# Patient Record
Sex: Male | Born: 1995 | Race: Black or African American | Hispanic: No | Marital: Single | Smoking: Current every day smoker
Health system: Southern US, Community
[De-identification: ages and names within clinical notes are randomized; demographics above are authoritative.]

## PROBLEM LIST (undated history)

## (undated) DIAGNOSIS — Z21 Asymptomatic human immunodeficiency virus [HIV] infection status: Secondary | ICD-10-CM

## (undated) DIAGNOSIS — B20 Human immunodeficiency virus [HIV] disease: Secondary | ICD-10-CM

## (undated) HISTORY — DX: Asymptomatic human immunodeficiency virus (hiv) infection status: Z21

## (undated) HISTORY — DX: Human immunodeficiency virus (HIV) disease: B20

---
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
## 2016-03-05 ENCOUNTER — Encounter (HOSPITAL_COMMUNITY): Payer: Self-pay

## 2016-03-05 ENCOUNTER — Emergency Department (HOSPITAL_COMMUNITY)
Admission: EM | Admit: 2016-03-05 | Discharge: 2016-03-05 | Disposition: A | Discharge status: Home / Self Care | Payer: Medicaid Other | Attending: Emergency Medicine | Admitting: Emergency Medicine

## 2016-03-05 DIAGNOSIS — R369 Urethral discharge, unspecified: Secondary | ICD-10-CM | POA: Diagnosis present

## 2016-03-05 DIAGNOSIS — F17200 Nicotine dependence, unspecified, uncomplicated: Secondary | ICD-10-CM | POA: Diagnosis not present

## 2016-03-05 DIAGNOSIS — Z7251 High risk heterosexual behavior: Secondary | ICD-10-CM

## 2016-03-05 MED ORDER — CEFTRIAXONE SODIUM 250 MG IJ SOLR
250.0000 mg | Freq: Once | INTRAMUSCULAR | Status: AC
  Administered 2016-03-05: 250 mg via INTRAMUSCULAR
  Filled 2016-03-05: qty 250

## 2016-03-05 MED ORDER — STERILE WATER FOR INJECTION IJ SOLN
INTRAMUSCULAR | Status: AC
  Filled 2016-03-05: qty 10

## 2016-03-05 MED ORDER — AZITHROMYCIN 250 MG PO TABS
1000.0000 mg | ORAL_TABLET | Freq: Once | ORAL | Status: AC
  Administered 2016-03-05: 1000 mg via ORAL
  Filled 2016-03-05: qty 4

## 2016-03-05 NOTE — ED Triage Notes (Signed)
Patient complains of penile discharge x 2 days, denies pain. NAD

## 2016-03-05 NOTE — ED Provider Notes (Signed)
[facility]-EMERGENCY DEPT Provider Note   CSN: [id number] Arrival date & time: 03/05/16  1619  By signing my name below, I, Eric Bass, attest that this documentation has been prepared under the direction and in the presence of  Eric BuffaloHope Ruvi Fullenwider, NP. Electronically Signed: Clovis PuAvnee Bass, ED Scribe. 03/05/16. 5:09 PM.   History   Chief Complaint Chief Complaint  Patient presents with  . Penile Discharge    The history is provided by the patient. No language interpreter was used.  Penile Discharge  This is a new problem. The current episode started yesterday. The problem has not changed since onset.Nothing aggravates the symptoms. Nothing relieves the symptoms. He has tried nothing for the symptoms.   HPI Comments:  Eric Bass is a 20 y.o. male, with a hx of chlamydia, who presents to the Emergency Department complaining of "greenish" penile discharge x 1 day. Pt notes he has had new sexual partners and last had unprotected sexual intercourse on 03/04/16. He also notes his girlfriend has been cheating on him. Pt also notes a mild rash on his R arm. No alleviating factors noted. He denies any penile pain, testicular medical problems and denies currently being on any medications. Pt is not followed by a PCP.   History reviewed. No pertinent past medical history.  Patient Active Problem List   Diagnosis Date Noted  . ATTENTION DEFICIT DISORDER 02/17/2008    History reviewed. No pertinent surgical history.   Home Medications    Prior to Admission medications   Medication Sig Start Date End Date Taking? Authorizing Provider  amphetamine-dextroamphetamine (ADDERALL, 5MG ,) 5 MG tablet 2 (two) times daily.      Historical Provider, MD    Family History No family history on file.  Social History Social History  Substance Use Topics  . Smoking status: Current Every Day Smoker  . Smokeless tobacco: Never Used  . Alcohol use No     Allergies   Review of patient's allergies indicates no  known allergies.   Review of Systems Review of Systems  Genitourinary: Positive for discharge. Negative for penile pain.  Skin: Positive for rash.   Physical Exam Updated Vital Signs BP 117/75 (BP Location: Right Arm)   Pulse 75   Temp 99.3 F (37.4 C) (Oral)   Resp 18   SpO2 100%   Physical Exam  Constitutional: He is oriented to person, place, and time. He appears well-developed and well-nourished. No distress.  HENT:  Head: Normocephalic and atraumatic.  Eyes: Conjunctivae are normal.  Cardiovascular: Normal rate.   Pulmonary/Chest: Effort normal.  Abdominal: He exhibits no distension.  Genitourinary:  Genitourinary Comments: BB sized lymph notes palpable to bilateral inguinal area.  Circumcised male. No testicular tenderness. Normal scrotum. No swelling of the penis, no rash or no lesions. Dry patchy skin to shaft of penis.   Neurological: He is alert and oriented to person, place, and time.  Skin: Skin is warm and dry.  Fine rash to R forearm and R side of upper abdomen. No open lesions.  Psychiatric: He has a normal mood and affect.  Nursing note and vitals reviewed.    ED Treatments / Results  DIAGNOSTIC STUDIES:  Oxygen Saturation is 100% on RA, normal by my interpretation.    COORDINATION OF CARE:  5:04 PM Discussed treatment plan with pt at bedside and pt agreed to plan.  Radiology No results found.  Procedures Procedures (including critical care time)  Medications Ordered in ED Medications  cefTRIAXone (ROCEPHIN) injection 250 mg (  250 mg Intramuscular Given 03/05/16 1740)  azithromycin (ZITHROMAX) tablet 1,000 mg (1,000 mg Oral Given 03/05/16 1740)     Initial Impression / Assessment and Plan / ED Course  I have reviewed the triage vital signs and the nursing notes.  Pertinent labs results that were available during my care of the patient were reviewed by me and considered in my medical decision making (see chart for details).  Clinical Course      Pt arrives for symptomatic STD check. Discussed safe sexual practices. Pt is advised to follow up for free testing at local health department in the future. Pt appears safe for discharge.    Final Clinical Impressions(s) / ED Diagnoses   Final diagnoses:  Penile discharge  Unprotected sex    New Prescriptions Discharge Medication List as of 03/05/2016  5:10 PM    I personally performed the services described in this documentation, which was scribed in my presence. The recorded information has been reviewed and is accurate.     [place], [place] 03/09/16 1157    Alvira Monday, MD 03/12/16 [phone]

## 2016-03-05 NOTE — ED Notes (Signed)
Declined W/C at D/C and was escorted to lobby by RN. 

## 2016-03-06 LAB — RPR, QUANT+TP ABS (REFLEX)
Rapid Plasma Reagin, Quant: 1:16 {titer} — ABNORMAL HIGH
TREPONEMA PALLIDUM AB: POSITIVE — AB

## 2016-03-06 LAB — GC/CHLAMYDIA PROBE AMP (~~LOC~~) NOT AT ARMC
Chlamydia: NEGATIVE
NEISSERIA GONORRHEA: NEGATIVE

## 2016-03-06 LAB — RPR: RPR Ser Ql: REACTIVE — AB

## 2016-03-07 ENCOUNTER — Telehealth: Payer: Self-pay

## 2016-03-07 ENCOUNTER — Telehealth (HOSPITAL_BASED_OUTPATIENT_CLINIC_OR_DEPARTMENT_OTHER): Payer: Self-pay | Admitting: Emergency Medicine

## 2016-03-07 LAB — HIV 1/2 AB DIFFERENTIATION
HIV 1 Ab: POSITIVE — AB
HIV 2 AB: NEGATIVE

## 2016-03-07 LAB — HIV ANTIBODY (ROUTINE TESTING W REFLEX)

## 2016-03-07 NOTE — Telephone Encounter (Signed)
Attempted to reach patient for a new infectious disease appointment.  His phone number does not have voicemail and he does not answer the phone.  I will defer to DIS .   Laurell Josephsammy K Daviel Allegretto, RN

## 2016-03-07 NOTE — Telephone Encounter (Signed)
Chart handoff to EDP for treatment plan for +RPR 1:16

## 2016-03-08 ENCOUNTER — Telehealth: Payer: Self-pay

## 2016-03-08 NOTE — Telephone Encounter (Signed)
[facility] received our referral and is looking for this patient.  He will go to his home today and I have give a scheduled appointment with Dr Ninetta LightsHatcher on 03-10-16. If Barbara CowerJason is not able to reach the patient he will call our office so the appointment can be cancelled.   Laurell Josephsammy K King, RN

## 2016-03-09 ENCOUNTER — Telehealth (HOSPITAL_BASED_OUTPATIENT_CLINIC_OR_DEPARTMENT_OTHER): Payer: Self-pay | Admitting: Emergency Medicine

## 2016-03-09 NOTE — Telephone Encounter (Signed)
Post ED Visit - Positive Culture Follow-up: Successful Patient Follow-Up  Culture assessed and recommendations reviewed by: []  Enzo BiNathan Batchelder, Pharm.D. []  Celedonio MiyamotoJeremy Frens, [address] BoulevardPharm.D., BCPS []  Garvin FilaMike Maccia, Pharm.D. []  Georgina PillionElizabeth Martin, Pharm.D., BCPS []  HermanMinh Pham, [address] BoulevardPharm.D., BCPS, AAHIVP []  Estella HuskMichelle Turner, Pharm.D., BCPS, AAHIVP []  Tennis Mustassie Stewart, Pharm.D. []  Sherle Poeob Vincent, [place]arm.D.  Positive RPR culture  [x]  Patient discharged without antimicrobial prescription and treatment is now indicated []  Organism is resistant to prescribed ED discharge antimicrobial []  Patient with positive blood cultures  Changes discussed with ED provider: Emmit AlexandersGeiple PA New antibiotic prescription needs to return to ED for IM Bicillin  Attempting to contact patient   Berle MullMiller, Hadja Harral 03/09/2016, 4:25 PM

## 2016-03-10 ENCOUNTER — Ambulatory Visit (INDEPENDENT_AMBULATORY_CARE_PROVIDER_SITE_OTHER): Payer: Self-pay | Admitting: Infectious Diseases

## 2016-03-10 ENCOUNTER — Encounter: Payer: Self-pay | Admitting: Infectious Diseases

## 2016-03-10 DIAGNOSIS — A539 Syphilis, unspecified: Secondary | ICD-10-CM | POA: Insufficient documentation

## 2016-03-10 DIAGNOSIS — B20 Human immunodeficiency virus [HIV] disease: Secondary | ICD-10-CM | POA: Insufficient documentation

## 2016-03-10 DIAGNOSIS — Z23 Encounter for immunization: Secondary | ICD-10-CM

## 2016-03-10 DIAGNOSIS — Z79899 Other long term (current) drug therapy: Secondary | ICD-10-CM

## 2016-03-10 LAB — COMPREHENSIVE METABOLIC PANEL
ALK PHOS: 101 U/L (ref 48–230)
ALT: 8 U/L (ref 8–46)
AST: 27 U/L (ref 12–32)
Albumin: 4 g/dL (ref 3.6–5.1)
BILIRUBIN TOTAL: 0.5 mg/dL (ref 0.2–1.1)
BUN: 12 mg/dL (ref 7–20)
CO2: 26 mmol/L (ref 20–31)
CREATININE: 1.16 mg/dL (ref 0.60–1.26)
Calcium: 9.7 mg/dL (ref 8.9–10.4)
Chloride: 102 mmol/L (ref 98–110)
Glucose, Bld: 88 mg/dL (ref 65–99)
Potassium: 4.2 mmol/L (ref 3.8–5.1)
SODIUM: 138 mmol/L (ref 135–146)
TOTAL PROTEIN: 7.9 g/dL (ref 6.3–8.2)

## 2016-03-10 LAB — T-HELPER CELL (CD4) - (RCID CLINIC ONLY)
CD4 T CELL ABS: 700 /uL (ref 400–2700)
CD4 T CELL HELPER: 25 % — AB (ref 33–55)

## 2016-03-10 LAB — URINALYSIS, ROUTINE W REFLEX MICROSCOPIC
BILIRUBIN URINE: NEGATIVE
Glucose, UA: NEGATIVE
Hgb urine dipstick: NEGATIVE
Leukocytes, UA: NEGATIVE
Nitrite: NEGATIVE
PH: 7 (ref 5.0–8.0)
SPECIFIC GRAVITY, URINE: 1.026 (ref 1.001–1.035)

## 2016-03-10 LAB — CBC
HCT: 45.2 % (ref 38.5–50.0)
Hemoglobin: 15.5 g/dL (ref 13.2–17.1)
MCH: 32.1 pg (ref 27.0–33.0)
MCHC: 34.3 g/dL (ref 32.0–36.0)
MCV: 93.6 fL (ref 80.0–100.0)
MPV: 10.9 fL (ref 7.5–12.5)
Platelets: 254 10*3/uL (ref 140–400)
RBC: 4.83 MIL/uL (ref 4.20–5.80)
RDW: 13.1 % (ref 11.0–15.0)
WBC: 7.9 10*3/uL (ref 3.8–10.8)

## 2016-03-10 LAB — URINALYSIS, MICROSCOPIC ONLY
BACTERIA UA: NONE SEEN [HPF]
Casts: NONE SEEN [LPF]
Squamous Epithelial / LPF: NONE SEEN [HPF] (ref <=5)
WBC, UA: NONE SEEN WBC/HPF (ref <=5)
Yeast: NONE SEEN [HPF]

## 2016-03-10 LAB — LIPID PANEL
Cholesterol: 161 mg/dL (ref 125–170)
HDL: 44 mg/dL (ref 31–65)
LDL Cholesterol: 99 mg/dL (ref <110)
Total CHOL/HDL Ratio: 3.7 Ratio (ref <=5.0)
Triglycerides: 89 mg/dL (ref 38–152)
VLDL: 18 mg/dL (ref <30)

## 2016-03-10 MED ORDER — HPV QUADRIVALENT VACCINE IM SUSP: 0.5000 mL | Freq: Once | INTRAMUSCULAR | 0 refills | Status: AC

## 2016-03-10 NOTE — Assessment & Plan Note (Signed)
He appears to be doing. Will check his labs to stage him for starting art He has condoms He is not sexually active currently. We spoke about safe sex.  We spoke about potential for kids, longevity.  We spoke about importance of medicine adherence.  HPV and flu shots today.  Hep B and A based on his serologies.

## 2016-03-10 NOTE — Progress Notes (Signed)
   Subjective:    Patient ID: Eric Bass, male    DOB: [birth year], 20 y.o.   MRN: [id number]  HPI 20 yo M with hx of ADHD, recently dx HIV after being seen in ED with STD, dysuria 10-8. He was given azithro and ceftriaxone at that time.   He has gotten his first dose of IM PEN at health dept yesterday. He did not have allergic reaction.  Had STI (?) 3 years ago. He had a plasma donation in April, suspected to be negative at that time. Is only getting one PEN injection.   No results found for: HIV1RNAQUANT, HIV1RNAVL, CD4TABS  Feels well.  Condom use is "rarely" Heterosexual  The past medical history, family history and social history were reviewed/updated in EPIC   Review of Systems  Constitutional: Negative for appetite change, chills, fever and unexpected weight change.  HENT: Negative for mouth sores.   Respiratory: Negative for cough and shortness of breath.   Gastrointestinal: Negative for constipation and diarrhea.  Genitourinary: Negative for difficulty urinating, dysuria, frequency and genital sores.  Neurological: Negative for headaches.  Hematological: Negative for adenopathy.       Objective:   Physical Exam  Constitutional: He is oriented to person, place, and time. He appears well-developed and well-nourished.  HENT:  Mouth/Throat: No oropharyngeal exudate.  Eyes: EOM are normal. Pupils are equal, round, and reactive to light.  Neck: Neck supple.  Cardiovascular: Normal rate, regular rhythm and normal heart sounds.   Pulmonary/Chest: Effort normal and breath sounds normal.  Abdominal: Soft. Bowel sounds are normal. There is no tenderness. There is no rebound.  Musculoskeletal: He exhibits no edema.  Lymphadenopathy:    He has no cervical adenopathy.  Neurological: He is alert and oriented to person, place, and time.          Assessment & Plan:

## 2016-03-10 NOTE — Assessment & Plan Note (Signed)
Has been treated at health dept Will watch RPR

## 2016-03-11 LAB — HEPATITIS B SURFACE ANTIBODY,QUALITATIVE

## 2016-03-11 LAB — HEPATITIS C ANTIBODY: HCV AB: NEGATIVE

## 2016-03-11 LAB — HEPATITIS A ANTIBODY, TOTAL: Hep A Total Ab: REACTIVE — AB

## 2016-03-11 LAB — HEPATITIS B SURFACE ANTIGEN: Hepatitis B Surface Ag: NEGATIVE

## 2016-03-13 LAB — QUANTIFERON TB GOLD ASSAY (BLOOD)
INTERFERON GAMMA RELEASE ASSAY: NEGATIVE
Mitogen-Nil: 2.42 IU/mL
Quantiferon Nil Value: 0.03 IU/mL
Quantiferon Tb Ag Minus Nil Value: <0 IU/mL

## 2016-03-14 LAB — HIV-1 RNA ULTRAQUANT REFLEX TO GENTYP+
HIV 1 RNA Quant: 20560 copies/mL — ABNORMAL HIGH (ref <20)
HIV-1 RNA Quant, Log: 4.31 Log copies/mL — ABNORMAL HIGH (ref <1.30)

## 2016-03-21 LAB — HLA B*5701: HLA-B*5701 w/rflx HLA-B High: NEGATIVE

## 2016-03-22 ENCOUNTER — Ambulatory Visit (INDEPENDENT_AMBULATORY_CARE_PROVIDER_SITE_OTHER): Payer: Self-pay | Admitting: Infectious Diseases

## 2016-03-22 ENCOUNTER — Encounter: Payer: Self-pay | Admitting: Infectious Diseases

## 2016-03-22 VITALS — BP 120/74 | HR 70 | Temp 97.6°F | Wt 162.0 lb

## 2016-03-22 DIAGNOSIS — B20 Human immunodeficiency virus [HIV] disease: Secondary | ICD-10-CM

## 2016-03-22 MED ORDER — ELVITEG-COBIC-EMTRICIT-TENOFAF 150-150-200-10 MG PO TABS: 1.0000 | ORAL_TABLET | Freq: Every day | ORAL | 3 refills | Status: DC

## 2016-03-22 NOTE — Assessment & Plan Note (Addendum)
Will start him on genvoya.  Will get him onto ADAP  Have him come back and see pharm in 2-3 weeks.  rtc in 6 weeks.  Explained his CD4 and HIV RNA to him and his mother.  Encouraged him to drink more water for his constipation.  Await his genotype

## 2016-03-22 NOTE — Progress Notes (Signed)
   Subjective:    Patient ID: Eric Bass, male    DOB: [birth year], 20 y.o.   MRN: [id number]  HPI 20 yo M with hx of ADHD, recently dx HIV after being seen in ED with STD, dysuria 10-8. He was given azithro and ceftriaxone at that time.   He was seen in ID 12 days ago and had staging labs.    HIV 1 RNA Quant (copies/mL)  Date Value  03/10/2016 20,560 (H)   CD4 T Cell Abs (/uL)  Date Value  03/10/2016 700    Review of Systems  Constitutional: Negative for appetite change and unexpected weight change.  Gastrointestinal: Positive for constipation. Negative for diarrhea.  Genitourinary: Negative for difficulty urinating.  abd cramping. BM q2-3 days. Doesn't drink water. Drinks soda and kool aid.      Objective:   Physical Exam  Constitutional: He appears well-developed and well-nourished.  HENT:  Mouth/Throat: No oropharyngeal exudate.    Eyes: EOM are normal. Pupils are equal, round, and reactive to light.  Neck: Neck supple.  Cardiovascular: Normal rate, regular rhythm and normal heart sounds.   Pulmonary/Chest: Effort normal and breath sounds normal.  Abdominal: Soft. Bowel sounds are normal. There is no tenderness. There is no rebound.  Musculoskeletal: He exhibits no edema.      Assessment & Plan:

## 2016-03-23 LAB — HIV-1 GENOTYPR PLUS

## 2016-03-24 ENCOUNTER — Encounter: Payer: Self-pay | Admitting: Infectious Diseases

## 2016-03-30 ENCOUNTER — Encounter: Payer: Self-pay | Admitting: *Deleted

## 2016-04-12 ENCOUNTER — Other Ambulatory Visit (INDEPENDENT_AMBULATORY_CARE_PROVIDER_SITE_OTHER): Payer: Self-pay

## 2016-04-12 DIAGNOSIS — B20 Human immunodeficiency virus [HIV] disease: Secondary | ICD-10-CM

## 2016-04-13 LAB — T-HELPER CELL (CD4) - (RCID CLINIC ONLY)
CD4 % Helper T Cell: 29 % — ABNORMAL LOW (ref 33–55)
CD4 T CELL ABS: 930 /uL (ref 400–2700)

## 2016-04-14 LAB — HIV-1 RNA QUANT-NO REFLEX-BLD
HIV 1 RNA Quant: 89 copies/mL — ABNORMAL HIGH (ref <20)
HIV-1 RNA Quant, Log: 1.95 Log copies/mL — ABNORMAL HIGH (ref <1.30)

## 2016-04-26 ENCOUNTER — Other Ambulatory Visit: Payer: Self-pay | Admitting: *Deleted

## 2016-04-26 ENCOUNTER — Encounter: Payer: Self-pay | Admitting: Infectious Diseases

## 2016-04-26 ENCOUNTER — Ambulatory Visit (INDEPENDENT_AMBULATORY_CARE_PROVIDER_SITE_OTHER): Payer: Self-pay | Admitting: Infectious Diseases

## 2016-04-26 VITALS — BP 102/67 | HR 64 | Temp 98.4°F | Ht 73.5 in | Wt 163.0 lb

## 2016-04-26 DIAGNOSIS — A539 Syphilis, unspecified: Secondary | ICD-10-CM

## 2016-04-26 DIAGNOSIS — Z79899 Other long term (current) drug therapy: Secondary | ICD-10-CM

## 2016-04-26 DIAGNOSIS — B20 Human immunodeficiency virus [HIV] disease: Secondary | ICD-10-CM

## 2016-04-26 DIAGNOSIS — Z113 Encounter for screening for infections with a predominantly sexual mode of transmission: Secondary | ICD-10-CM

## 2016-04-26 MED ORDER — ELVITEG-COBIC-EMTRICIT-TENOFAF 150-150-200-10 MG PO TABS: 1.0000 | ORAL_TABLET | Freq: Every day | ORAL | 5 refills | Status: DC

## 2016-04-26 NOTE — Assessment & Plan Note (Signed)
He is doing well.  Will gets meds via ADAP now Rtc in 6  Months with labs prior.  Has gotten flu shot.

## 2016-04-26 NOTE — Assessment & Plan Note (Signed)
Will repeat RPR at f/u visit/

## 2016-04-26 NOTE — Progress Notes (Signed)
   Subjective:    Patient ID: Eric LeverJoshua O Bass, male    DOB: July 10, 1995, 20 y.o.   MRN: [id number]  HPI 20 yo M with newly dx HIV 02-2016. He was started on genvoya.  He is transitioning from [facility] to [facility].   Has been doing well. Has had no problem with genvoya.    HIV 1 RNA Quant (copies/mL)  Date Value  04/12/2016 89 (H)  03/10/2016 20,560 (H)   CD4 T Cell Abs (/uL)  Date Value  04/12/2016 930  03/10/2016 700    Review of Systems  Constitutional: Negative for appetite change and unexpected weight change.  Gastrointestinal: Negative for constipation and diarrhea.  Genitourinary: Negative for difficulty urinating and dysuria.  prev constipation improved with increased water intake.      Objective:   Physical Exam  Constitutional: He appears well-developed and well-nourished.  HENT:  Mouth/Throat: No oropharyngeal exudate.  Eyes: EOM are normal. Pupils are equal, round, and reactive to light.  Neck: Neck supple.  Cardiovascular: Normal rate, regular rhythm and normal heart sounds.   Pulmonary/Chest: Effort normal and breath sounds normal.  Abdominal: Soft. Bowel sounds are normal. There is no tenderness. There is no rebound.  Musculoskeletal: He exhibits no edema.  Lymphadenopathy:    He has no cervical adenopathy.      Assessment & Plan:

## 2016-06-22 ENCOUNTER — Encounter: Payer: Self-pay | Admitting: Infectious Diseases

## 2016-10-24 ENCOUNTER — Other Ambulatory Visit: Payer: Self-pay

## 2016-10-26 ENCOUNTER — Other Ambulatory Visit (INDEPENDENT_AMBULATORY_CARE_PROVIDER_SITE_OTHER): Payer: Self-pay

## 2016-10-26 DIAGNOSIS — A539 Syphilis, unspecified: Secondary | ICD-10-CM

## 2016-10-26 DIAGNOSIS — Z113 Encounter for screening for infections with a predominantly sexual mode of transmission: Secondary | ICD-10-CM

## 2016-10-26 DIAGNOSIS — B20 Human immunodeficiency virus [HIV] disease: Secondary | ICD-10-CM

## 2016-10-26 DIAGNOSIS — Z79899 Other long term (current) drug therapy: Secondary | ICD-10-CM

## 2016-10-26 LAB — CBC
HCT: 44.9 % (ref 38.5–50.0)
HEMOGLOBIN: 15.2 g/dL (ref 13.2–17.1)
MCH: 32.9 pg (ref 27.0–33.0)
MCHC: 33.9 g/dL (ref 32.0–36.0)
MCV: 97.2 fL (ref 80.0–100.0)
MPV: 11.4 fL (ref 7.5–12.5)
Platelets: 200 10*3/uL (ref 140–400)
RBC: 4.62 MIL/uL (ref 4.20–5.80)
RDW: 13.2 % (ref 11.0–15.0)
WBC: 3.7 10*3/uL — AB (ref 3.8–10.8)

## 2016-10-26 LAB — COMPREHENSIVE METABOLIC PANEL
ALBUMIN: 4.2 g/dL (ref 3.6–5.1)
ALT: 12 U/L (ref 9–46)
AST: 24 U/L (ref 10–40)
Alkaline Phosphatase: 92 U/L (ref 40–115)
BUN: 10 mg/dL (ref 7–25)
CO2: 27 mmol/L (ref 20–31)
CREATININE: 1.27 mg/dL (ref 0.60–1.35)
Calcium: 9.1 mg/dL (ref 8.6–10.3)
Chloride: 104 mmol/L (ref 98–110)
GLUCOSE: 75 mg/dL (ref 65–99)
Potassium: 4.5 mmol/L (ref 3.5–5.3)
Sodium: 140 mmol/L (ref 135–146)
Total Bilirubin: 0.4 mg/dL (ref 0.2–1.2)
Total Protein: 7.4 g/dL (ref 6.1–8.1)

## 2016-10-26 LAB — LIPID PANEL
Cholesterol: 174 mg/dL (ref <200)
HDL: 65 mg/dL (ref >40)
LDL Cholesterol: 95 mg/dL (ref <100)
Total CHOL/HDL Ratio: 2.7 Ratio (ref <5.0)
Triglycerides: 71 mg/dL (ref <150)
VLDL: 14 mg/dL (ref <30)

## 2016-10-27 LAB — URINE CYTOLOGY ANCILLARY ONLY
Chlamydia: NEGATIVE
Neisseria Gonorrhea: NEGATIVE

## 2016-10-27 LAB — T-HELPER CELL (CD4) - (RCID CLINIC ONLY)
CD4 T CELL ABS: 800 /uL (ref 400–2700)
CD4 T CELL HELPER: 30 % — AB (ref 33–55)

## 2016-10-27 LAB — RPR

## 2016-10-28 LAB — HIV-1 RNA QUANT-NO REFLEX-BLD
HIV 1 RNA Quant: <20 copies/mL — AB
HIV-1 RNA Quant, Log: <1.3 Log copies/mL — AB

## 2016-11-07 ENCOUNTER — Ambulatory Visit: Payer: Self-pay | Admitting: Infectious Diseases

## 2016-11-08 ENCOUNTER — Encounter: Payer: Self-pay | Admitting: Infectious Diseases

## 2016-11-08 ENCOUNTER — Ambulatory Visit (INDEPENDENT_AMBULATORY_CARE_PROVIDER_SITE_OTHER): Admitting: Infectious Diseases

## 2016-11-08 DIAGNOSIS — B20 Human immunodeficiency virus [HIV] disease: Secondary | ICD-10-CM | POA: Diagnosis not present

## 2016-11-08 DIAGNOSIS — Z23 Encounter for immunization: Secondary | ICD-10-CM | POA: Diagnosis not present

## 2016-11-08 DIAGNOSIS — A539 Syphilis, unspecified: Secondary | ICD-10-CM | POA: Diagnosis not present

## 2016-11-08 NOTE — Progress Notes (Signed)
   Subjective:    Patient ID: Eric Bass, male    DOB: 18-May-1996, 21 y.o.   MRN: [id number]  HPI 21 yo M dx HIV 02-2016. He was started on genvoya.  He is currently incarcerated. Release in August.  Has been feeling well.  Has been getting meds.   HIV 1 RNA Quant (copies/mL)  Date Value  10/26/2016 <20 DETECTED (A)  04/12/2016 89 (H)  03/10/2016 20,560 (H)   CD4 T Cell Abs (/uL)  Date Value  10/26/2016 800  04/12/2016 930  03/10/2016 700    Review of Systems  Constitutional: Negative for activity change, appetite change, fever and unexpected weight change.  Respiratory: Negative for shortness of breath.   Gastrointestinal: Negative for constipation and diarrhea.  Genitourinary: Negative for difficulty urinating.  Neurological: Negative for headaches.  Psychiatric/Behavioral: Negative for dysphoric mood.       Objective:   Physical Exam  HENT:  Mouth/Throat: No oropharyngeal exudate.  Eyes: EOM are normal. Pupils are equal, round, and reactive to light.  Neck: Neck supple.  Cardiovascular: Normal rate, regular rhythm and normal heart sounds.   Pulmonary/Chest: Effort normal and breath sounds normal.  Abdominal: Soft. Bowel sounds are normal. There is no tenderness. There is no rebound.  Musculoskeletal: He exhibits no edema.  Lymphadenopathy:    He has no cervical adenopathy.  Psychiatric: He has a normal mood and affect.      Assessment & Plan:

## 2016-11-08 NOTE — Assessment & Plan Note (Signed)
He is doing very well Will see him back in 6 months.  HPV today.  F/u with ADAP coordinator.  Offered/refused condoms.

## 2016-11-08 NOTE — Addendum Note (Signed)
Addended by: Linnell FullingBRANNON, LATOYA N on: 11/08/2016 04:21 PM   Modules accepted: Orders

## 2016-11-08 NOTE — Assessment & Plan Note (Signed)
RPR (-) 09-2016

## 2016-11-17 ENCOUNTER — Other Ambulatory Visit: Payer: Self-pay | Admitting: Infectious Diseases

## 2016-11-17 DIAGNOSIS — B20 Human immunodeficiency virus [HIV] disease: Secondary | ICD-10-CM

## 2016-12-28 ENCOUNTER — Encounter: Payer: Self-pay | Admitting: Infectious Diseases

## 2017-06-07 ENCOUNTER — Other Ambulatory Visit: Payer: Self-pay

## 2017-06-07 ENCOUNTER — Ambulatory Visit

## 2017-06-07 DIAGNOSIS — B20 Human immunodeficiency virus [HIV] disease: Secondary | ICD-10-CM

## 2017-06-08 LAB — T-HELPER CELL (CD4) - (RCID CLINIC ONLY)
CD4 % Helper T Cell: 25 % — ABNORMAL LOW (ref 33–55)
CD4 T CELL ABS: 740 /uL (ref 400–2700)

## 2017-06-11 LAB — HIV-1 RNA QUANT-NO REFLEX-BLD
HIV 1 RNA QUANT: NOT DETECTED {copies}/mL
HIV-1 RNA QUANT, LOG: NOT DETECTED {Log_copies}/mL

## 2017-06-21 ENCOUNTER — Encounter: Payer: Self-pay | Admitting: Infectious Diseases

## 2017-06-21 ENCOUNTER — Ambulatory Visit (INDEPENDENT_AMBULATORY_CARE_PROVIDER_SITE_OTHER): Payer: Self-pay | Admitting: Infectious Diseases

## 2017-06-21 ENCOUNTER — Ambulatory Visit: Payer: Self-pay

## 2017-06-21 VITALS — BP 110/64 | HR 97 | Temp 99.6°F | Ht 73.0 in | Wt 175.0 lb

## 2017-06-21 DIAGNOSIS — B20 Human immunodeficiency virus [HIV] disease: Secondary | ICD-10-CM

## 2017-06-21 DIAGNOSIS — Z79899 Other long term (current) drug therapy: Secondary | ICD-10-CM

## 2017-06-21 DIAGNOSIS — Z113 Encounter for screening for infections with a predominantly sexual mode of transmission: Secondary | ICD-10-CM

## 2017-06-21 NOTE — Assessment & Plan Note (Signed)
He is doing very well.  Offered/refused condoms.  Not sexually active.  Has questions about prep, contagiousness, having kids. He is undetectable.  vax up to date.  rtc in 8 months.

## 2017-06-21 NOTE — Progress Notes (Signed)
   Subjective:    Patient ID: Eric Bass, male    DOB: 12/15/1995, 22 y.o.   MRN: [id number]  HPI 22 yo M dx HIV 02-2016. He was started on genvoya.  He is feeling well. Has been doing well with ART. Has re-upped his ADAP today.    HIV 1 RNA Quant (copies/mL)  Date Value  06/07/2017 <20 NOT DETECTED  10/26/2016 <20 DETECTED (A)  04/12/2016 89 (H)   CD4 T Cell Abs (/uL)  Date Value  06/07/2017 740  10/26/2016 800  04/12/2016 930    Review of Systems  Constitutional: Negative for appetite change, chills, fever and unexpected weight change.  Eyes: Negative for visual disturbance.  Respiratory: Negative for cough and shortness of breath.   Gastrointestinal: Negative for constipation and diarrhea.  Genitourinary: Negative for difficulty urinating and genital sores.  Psychiatric/Behavioral: Negative for dysphoric mood and sleep disturbance.  Please see HPI. All other systems reviewed and negative.     Objective:   Physical Exam  Constitutional: He appears well-developed.  HENT:  Mouth/Throat: No oropharyngeal exudate.  Eyes: EOM are normal. Pupils are equal, round, and reactive to light.  Neck: Neck supple.  Cardiovascular: Normal rate, regular rhythm and normal heart sounds.  Pulmonary/Chest: Effort normal and breath sounds normal.  Abdominal: Soft. Bowel sounds are normal. There is no tenderness. There is no rebound.  Musculoskeletal: He exhibits no edema.  Lymphadenopathy:    He has no cervical adenopathy.  Psychiatric: He has a normal mood and affect.       Assessment & Plan:

## 2017-06-25 ENCOUNTER — Other Ambulatory Visit: Payer: Self-pay | Admitting: Infectious Diseases

## 2017-06-25 DIAGNOSIS — B20 Human immunodeficiency virus [HIV] disease: Secondary | ICD-10-CM

## 2017-07-04 ENCOUNTER — Emergency Department (HOSPITAL_COMMUNITY)
Admission: EM | Admit: 2017-07-04 | Discharge: 2017-07-04 | Disposition: A | Discharge status: Home / Self Care | Attending: Emergency Medicine | Admitting: Emergency Medicine

## 2017-07-04 ENCOUNTER — Encounter (HOSPITAL_COMMUNITY): Payer: Self-pay

## 2017-07-04 DIAGNOSIS — J029 Acute pharyngitis, unspecified: Secondary | ICD-10-CM | POA: Insufficient documentation

## 2017-07-04 DIAGNOSIS — Z5321 Procedure and treatment not carried out due to patient leaving prior to being seen by health care provider: Secondary | ICD-10-CM | POA: Insufficient documentation

## 2017-07-04 NOTE — ED Triage Notes (Signed)
patient complains 2-3 days of sore throat. No other associated symptoms, aNAD

## 2017-07-04 NOTE — ED Notes (Signed)
Third call in lobby, no response. RN notified.

## 2017-07-04 NOTE — ED Notes (Signed)
Second call in lobby. No response. 

## 2017-07-04 NOTE — ED Notes (Signed)
Pt called in lobby to be roomed. No response. 

## 2017-07-20 ENCOUNTER — Encounter: Payer: Self-pay | Admitting: Infectious Diseases

## 2017-07-26 ENCOUNTER — Other Ambulatory Visit: Payer: Self-pay | Admitting: Infectious Diseases

## 2017-07-26 DIAGNOSIS — B20 Human immunodeficiency virus [HIV] disease: Secondary | ICD-10-CM

## 2017-10-27 ENCOUNTER — Emergency Department (HOSPITAL_COMMUNITY)
Admission: EM | Admit: 2017-10-27 | Discharge: 2017-10-27 | Disposition: A | Discharge status: Home / Self Care | Attending: Emergency Medicine | Admitting: Emergency Medicine

## 2017-10-27 ENCOUNTER — Other Ambulatory Visit: Payer: Self-pay

## 2017-10-27 ENCOUNTER — Encounter (HOSPITAL_COMMUNITY): Payer: Self-pay

## 2017-10-27 DIAGNOSIS — Z043 Encounter for examination and observation following other accident: Secondary | ICD-10-CM | POA: Insufficient documentation

## 2017-10-27 DIAGNOSIS — Z5321 Procedure and treatment not carried out due to patient leaving prior to being seen by health care provider: Secondary | ICD-10-CM | POA: Insufficient documentation
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
## 2017-11-26 ENCOUNTER — Ambulatory Visit: Payer: Self-pay

## 2017-12-19 ENCOUNTER — Encounter: Payer: Self-pay | Admitting: Infectious Diseases

## 2018-01-22 ENCOUNTER — Telehealth: Payer: Self-pay | Admitting: Behavioral Health

## 2018-01-22 NOTE — Telephone Encounter (Addendum)
Patient called in today stating he has tested positive for syphilis in the past and was treated.  He states he has had a new partner and currently has a rash on the palms of his hands and feet.  He thinks he may have been exposed to Syphilis again.  Advised him to go to the health department to be tested and treated.  Gave him the information to make an appointment at the health department.  Notified him of the next appointment he has scheduled with [facility].  Patient was appreciative and verbalized understanding. Angeline SlimAshley Chrisette Man RN

## 2018-02-04 ENCOUNTER — Emergency Department (HOSPITAL_COMMUNITY)
Admission: EM | Admit: 2018-02-04 | Discharge: 2018-02-04 | Discharge status: Against Medical Advice | Payer: Medicaid Other

## 2018-02-04 ENCOUNTER — Other Ambulatory Visit: Payer: Self-pay

## 2018-02-04 NOTE — ED Notes (Signed)
Called for triage x3, no answer. 

## 2018-02-05 ENCOUNTER — Other Ambulatory Visit

## 2018-02-11 ENCOUNTER — Encounter (HOSPITAL_COMMUNITY): Payer: Self-pay

## 2018-02-11 ENCOUNTER — Ambulatory Visit (HOSPITAL_COMMUNITY)
Admission: EM | Admit: 2018-02-11 | Discharge: 2018-02-11 | Disposition: A | Discharge status: Home / Self Care | Payer: Medicaid Other | Attending: Family Medicine | Admitting: Family Medicine

## 2018-02-11 DIAGNOSIS — A549 Gonococcal infection, unspecified: Secondary | ICD-10-CM

## 2018-02-11 DIAGNOSIS — Z21 Asymptomatic human immunodeficiency virus [HIV] infection status: Secondary | ICD-10-CM

## 2018-02-11 MED ORDER — CEFTRIAXONE SODIUM 250 MG IJ SOLR
INTRAMUSCULAR | Status: AC
  Filled 2018-02-11: qty 250

## 2018-02-11 MED ORDER — LIDOCAINE HCL (PF) 1 % IJ SOLN
INTRAMUSCULAR | Status: AC
  Filled 2018-02-11: qty 2

## 2018-02-11 MED ORDER — CEFTRIAXONE SODIUM 250 MG IJ SOLR
250.0000 mg | Freq: Once | INTRAMUSCULAR | Status: AC
  Administered 2018-02-11: 250 mg via INTRAMUSCULAR

## 2018-02-11 NOTE — Discharge Instructions (Signed)
You have been given the following medications today for treatment of gonorrhea.  cefTRIAXone (ROCEPHIN) injection 250 mg  Please refrain from all sexual activity for at least the next seven days.

## 2018-02-11 NOTE — ED Triage Notes (Signed)
Pt presents with complaints of coming in contact with STD after girlfriend cheated on him.  [facility] he had [facility] and was treated then got a phone call that he did not have Syphilis and had rectal gonorrhea. Pt has not been treated for gonorrhea.

## 2018-02-11 NOTE — ED Provider Notes (Signed)
[facility]   [id number] 02/11/18 Arrival Time: 1002  ASSESSMENT & PLAN:  1. Gonorrhea      Discharge Instructions     You have been given the following medications today for treatment of gonorrhea.  cefTRIAXone (ROCEPHIN) injection 250 mg  Please refrain from all sexual activity for at least the next seven days.    Reviewed expectations re: course of current medical issues. Questions answered. Outlined signs and symptoms indicating need for more acute intervention. Patient verbalized understanding. After Visit Summary given.   SUBJECTIVE:  Eric Bass is a 22 y.o. male who reports being called by his PCP and told his recent STD testing was positive for gonorrhea. Here for treatment. Afebrile. No current symptoms reported. HIV positive. Followed by ID.  ROS: As per HPI.  OBJECTIVE:  Vitals:   02/11/18 1121  BP: 111/71  Pulse: 75  Resp: 19  Temp: 98 F (36.7 C)  SpO2: 95%    General appearance: alert, cooperative, appears stated age and no distress Throat: lips, mucosa, and tongue normal; teeth and gums normal Back: no CVA tenderness GU: deferred Abdomen: soft, non-tender Skin: warm and dry Psychological: alert and cooperative. Normal mood and affect.  Results for orders placed or performed in visit on 06/07/17  HIV 1 RNA quant-no reflex-bld  Result Value Ref Range   HIV 1 RNA Quant <20 NOT DETECTED NOT DETECT copies/mL   HIV-1 RNA Quant, Log <1.30 NOT DETECTED NOT DETECT Log copies/mL  T-helper cell (CD4)- ([facility] only)  Result Value Ref Range   CD4 T Cell Abs 740 400 - 2,700 /uL   CD4 % Helper T Cell 25 (L) 33 - 55 %    Labs Reviewed - No data to display  No Known Allergies  Past Medical History:  Diagnosis Date  . HIV (human immunodeficiency virus infection) (HCC)    Family History  Problem Relation Age of Onset  . Hypertension Mother   . Diabetes Maternal Grandmother   . Hypertension Paternal Grandmother   .  Tuberculosis Paternal Grandmother   . Diabetes Paternal Grandfather   . Tuberculosis Paternal Grandfather    Social History   Socioeconomic History  . Marital status: Single    Spouse name: Not on file  . Number of children: Not on file  . Years of education: Not on file  . Highest education level: Not on file  Occupational History  . Not on file  Social Needs  . Financial resource strain: Not on file  . Food insecurity:    Worry: Not on file    Inability: Not on file  . Transportation needs:    Medical: Not on file    Non-medical: Not on file  Tobacco Use  . Smoking status: Current Every Day Smoker    Packs/day: 1.00    Types: Cigarettes    Start date: 05/30/1991  . Smokeless tobacco: Never Used  . Tobacco comment: not interested in quitting  Substance and Sexual Activity  . Alcohol use: Yes    Alcohol/week: 4.0 standard drinks    Types: 4 Shots of liquor per week    Comment: socially  . Drug use: Yes    Types: Marijuana    Comment: daily  . Sexual activity: Yes    Partners: Female    Comment: patient declined condoms  Lifestyle  . Physical activity:    Days per week: Not on file    Minutes per session: Not on file  . Stress: Not on  file  Relationships  . Social connections:    Talks on phone: Not on file    Gets together: Not on file    Attends religious service: Not on file    Active member of club or organization: Not on file    Attends meetings of clubs or organizations: Not on file    Relationship status: Not on file  . Intimate partner violence:    Fear of current or ex partner: Not on file    Emotionally abused: Not on file    Physically abused: Not on file    Forced sexual activity: Not on file  Other Topics Concern  . Not on file  Social History Narrative  . Not on file          Mardella Layman, MD 02/11/18 1237

## 2018-02-20 ENCOUNTER — Ambulatory Visit: Admitting: Infectious Diseases

## 2018-02-20 ENCOUNTER — Telehealth: Payer: Self-pay

## 2018-02-20 NOTE — Telephone Encounter (Signed)
Pharmacy called today requesting additional contact information due to issues reaching patient. Spoke with Mitzi Davenport who was able to take home phone number listed in patients chart. Pharmacy will attempt to call patient again with number given. Lorenso Courier, [place]

## 2018-03-04 ENCOUNTER — Other Ambulatory Visit

## 2018-03-07 ENCOUNTER — Other Ambulatory Visit (HOSPITAL_COMMUNITY)
Admission: RE | Admit: 2018-03-07 | Discharge: 2018-03-07 | Disposition: A | Discharge status: Home / Self Care | Payer: Medicaid Other | Source: Ambulatory Visit | Attending: Infectious Diseases | Admitting: Infectious Diseases

## 2018-03-07 ENCOUNTER — Other Ambulatory Visit: Payer: Medicaid Other

## 2018-03-07 DIAGNOSIS — Z113 Encounter for screening for infections with a predominantly sexual mode of transmission: Secondary | ICD-10-CM

## 2018-03-07 DIAGNOSIS — B20 Human immunodeficiency virus [HIV] disease: Secondary | ICD-10-CM

## 2018-03-07 DIAGNOSIS — Z79899 Other long term (current) drug therapy: Secondary | ICD-10-CM

## 2018-03-08 ENCOUNTER — Telehealth: Payer: Self-pay | Admitting: Behavioral Health

## 2018-03-08 LAB — T-HELPER CELL (CD4) - (RCID CLINIC ONLY)
CD4 % Helper T Cell: 37 % (ref 33–55)
CD4 T CELL ABS: 1180 /uL (ref 400–2700)

## 2018-03-08 LAB — URINE CYTOLOGY ANCILLARY ONLY
Chlamydia: NEGATIVE
Neisseria Gonorrhea: NEGATIVE

## 2018-03-08 NOTE — Telephone Encounter (Addendum)
Called patient x2, unable to leave voicemail because voicemail box is not set up.  Patient has an upcoming appointment with Dr. Ninetta Lights 03/18/2018.   Angeline Slim RN

## 2018-03-08 NOTE — Telephone Encounter (Signed)
-----   Message from Ginnie Smart, MD sent at 03/08/2018  3:31 PM EDT ----- Pt needs to be treated for syphilis.  Needs IM Pen G 2.4 million once weekly times 3 Thanks  ----- Message ----- From: Interface, [facility] Lab Results In Sent: 03/07/2018   3:11 PM EDT To: Ginnie Smart, MD

## 2018-03-11 LAB — COMPREHENSIVE METABOLIC PANEL
AG RATIO: 1.3 (calc) (ref 1.0–2.5)
ALBUMIN MSPROF: 4 g/dL (ref 3.6–5.1)
ALT: 10 U/L (ref 9–46)
AST: 25 U/L (ref 10–40)
Alkaline phosphatase (APISO): 91 U/L (ref 40–115)
BUN: 12 mg/dL (ref 7–25)
CHLORIDE: 103 mmol/L (ref 98–110)
CO2: 28 mmol/L (ref 20–32)
CREATININE: 1.06 mg/dL (ref 0.60–1.35)
Calcium: 9.4 mg/dL (ref 8.6–10.3)
GLOBULIN: 3.1 g/dL (ref 1.9–3.7)
GLUCOSE: 73 mg/dL (ref 65–99)
Potassium: 4 mmol/L (ref 3.5–5.3)
Sodium: 138 mmol/L (ref 135–146)
Total Bilirubin: 0.5 mg/dL (ref 0.2–1.2)
Total Protein: 7.1 g/dL (ref 6.1–8.1)

## 2018-03-11 LAB — RPR TITER

## 2018-03-11 LAB — CBC
HEMATOCRIT: 44.5 % (ref 38.5–50.0)
Hemoglobin: 15.3 g/dL (ref 13.2–17.1)
MCH: 32.8 pg (ref 27.0–33.0)
MCHC: 34.4 g/dL (ref 32.0–36.0)
MCV: 95.5 fL (ref 80.0–100.0)
MPV: 11.5 fL (ref 7.5–12.5)
PLATELETS: 225 10*3/uL (ref 140–400)
RBC: 4.66 10*6/uL (ref 4.20–5.80)
RDW: 13 % (ref 11.0–15.0)
WBC: 5.8 10*3/uL (ref 3.8–10.8)

## 2018-03-11 LAB — LIPID PANEL
Cholesterol: 162 mg/dL (ref <200)
HDL: 63 mg/dL (ref >40)
LDL Cholesterol (Calc): 78 mg/dL (calc)
Non-HDL Cholesterol (Calc): 99 mg/dL (calc) (ref <130)
TRIGLYCERIDES: 128 mg/dL (ref <150)
Total CHOL/HDL Ratio: 2.6 (calc) (ref <5.0)

## 2018-03-11 LAB — HEPATITIS B SURFACE ANTIBODY,QUALITATIVE: HEP B S AB: BORDERLINE — AB

## 2018-03-11 LAB — FLUORESCENT TREPONEMAL AB(FTA)-IGG-BLD: FLUORESCENT TREPONEMAL ABS: REACTIVE — AB

## 2018-03-11 LAB — RPR: RPR: REACTIVE — AB

## 2018-03-11 LAB — HIV-1 RNA QUANT-NO REFLEX-BLD
HIV 1 RNA Quant: <20 copies/mL
HIV-1 RNA Quant, Log: <1.3 Log copies/mL

## 2018-03-11 NOTE — Telephone Encounter (Signed)
Called Eric Bass verified identity.  Informed him per Dr. Ninetta Lights that he tested positive for Syphilis and needs to be treated with Bicillin time 3 weeks. Patient verbalized understanding and appointments made.  Patient denies Penicillin allergy. Angeline Slim RN

## 2018-03-12 ENCOUNTER — Ambulatory Visit (INDEPENDENT_AMBULATORY_CARE_PROVIDER_SITE_OTHER): Payer: Self-pay | Admitting: Behavioral Health

## 2018-03-12 DIAGNOSIS — A539 Syphilis, unspecified: Secondary | ICD-10-CM

## 2018-03-12 MED ORDER — PENICILLIN G BENZATHINE 1200000 UNIT/2ML IM SUSP
1.2000 10*6.[IU] | Freq: Once | INTRAMUSCULAR | Status: AC
  Administered 2018-03-12: 1.2 10*6.[IU] via INTRAMUSCULAR

## 2018-03-12 NOTE — Progress Notes (Signed)
Patient came in today to be treated for Syphilis.  Patient received Bicillin 2.4 million units today.  Patient tolerated well.  Offered condoms, patient refused.  Patient aware if all upcoming appointment. Angeline Slim RN

## 2018-03-18 ENCOUNTER — Ambulatory Visit (INDEPENDENT_AMBULATORY_CARE_PROVIDER_SITE_OTHER): Payer: Self-pay | Admitting: Infectious Diseases

## 2018-03-18 ENCOUNTER — Encounter: Payer: Self-pay | Admitting: Infectious Diseases

## 2018-03-18 ENCOUNTER — Encounter: Admitting: Infectious Diseases

## 2018-03-18 VITALS — BP 121/76 | HR 76 | Temp 98.3°F | Wt 175.0 lb

## 2018-03-18 DIAGNOSIS — B20 Human immunodeficiency virus [HIV] disease: Secondary | ICD-10-CM

## 2018-03-18 DIAGNOSIS — Z113 Encounter for screening for infections with a predominantly sexual mode of transmission: Secondary | ICD-10-CM

## 2018-03-18 DIAGNOSIS — Z79899 Other long term (current) drug therapy: Secondary | ICD-10-CM

## 2018-03-18 DIAGNOSIS — A539 Syphilis, unspecified: Secondary | ICD-10-CM

## 2018-03-18 DIAGNOSIS — Z23 Encounter for immunization: Secondary | ICD-10-CM

## 2018-03-18 MED ORDER — PENICILLIN G BENZATHINE 1200000 UNIT/2ML IM SUSP
1.2000 10*6.[IU] | Freq: Once | INTRAMUSCULAR | Status: AC
  Administered 2018-03-18: 1.2 10*6.[IU] via INTRAMUSCULAR

## 2018-03-18 NOTE — Assessment & Plan Note (Signed)
Pen G #2 today. Will need next dose in 1 week.  rtc in 9 months.

## 2018-03-18 NOTE — Progress Notes (Signed)
   Subjective:    Patient ID: Eric Bass, male    DOB: 05-18-1996, 22 y.o.   MRN: [id number]  HPI 22yo M dx HIV 02-2016. He was started on genvoya.  He is feeling well. Has been doing well with ART.  Has been treated several time for syphilis. Was given 1 shot previously this month.Was treated for [facility] 01-2018.    Denies that he has been sexually active in the intervening period.  Quit taking genvoya due to ETOH abuse after his mom had CVA and MI. Missed "a couple days".  Mom is improved now.   HIV 1 RNA Quant (copies/mL)  Date Value  03/07/2018 <20 NOT DETECTED  06/07/2017 <20 NOT DETECTED  10/26/2016 <20 DETECTED (A)   CD4 T Cell Abs (/uL)  Date Value  03/07/2018 1,180  06/07/2017 740  10/26/2016 800    Review of Systems  Constitutional: Negative for appetite change and unexpected weight change.  Gastrointestinal: Negative for constipation and diarrhea.  Genitourinary: Negative for difficulty urinating and genital sores.  Psychiatric/Behavioral: The patient is nervous/anxious.   Please see HPI. All other systems reviewed and negative.      Objective:   Physical Exam  Constitutional: He is oriented to person, place, and time. He appears well-developed and well-nourished.  HENT:  Mouth/Throat: No oropharyngeal exudate.  Eyes: Pupils are equal, round, and reactive to light. EOM are normal.  Neck: Normal range of motion. Neck supple.  Cardiovascular: Normal rate, regular rhythm and normal heart sounds.  Pulmonary/Chest: Effort normal and breath sounds normal.  Abdominal: Soft. Bowel sounds are normal. He exhibits no distension. There is no tenderness.  Musculoskeletal: He exhibits no edema.  Lymphadenopathy:    He has no cervical adenopathy.  Neurological: He is alert and oriented to person, place, and time.  Skin: Rash noted.          Assessment & Plan:

## 2018-03-18 NOTE — Assessment & Plan Note (Signed)
Given condoms Flu vax today.  He is doing well with his ART.  rtc in 9 months.

## 2018-03-18 NOTE — Addendum Note (Signed)
Addended by: Lurlean Leyden on: 03/18/2018 04:52 PM   Modules accepted: Orders

## 2018-03-25 ENCOUNTER — Ambulatory Visit: Payer: Medicaid Other

## 2018-04-17 ENCOUNTER — Encounter: Payer: Self-pay | Admitting: Infectious Diseases

## 2018-07-03 ENCOUNTER — Encounter: Payer: Self-pay | Admitting: Infectious Diseases

## 2018-07-27 ENCOUNTER — Other Ambulatory Visit: Payer: Self-pay | Admitting: Infectious Diseases

## 2018-07-27 DIAGNOSIS — B20 Human immunodeficiency virus [HIV] disease: Secondary | ICD-10-CM

## 2018-08-18 ENCOUNTER — Other Ambulatory Visit: Payer: Self-pay

## 2018-08-18 ENCOUNTER — Emergency Department (HOSPITAL_COMMUNITY)
Admission: EM | Admit: 2018-08-18 | Discharge: 2018-08-18 | Disposition: A | Discharge status: Home / Self Care | Payer: Medicaid Other | Attending: Emergency Medicine | Admitting: Emergency Medicine

## 2018-08-18 ENCOUNTER — Encounter (HOSPITAL_COMMUNITY): Payer: Self-pay | Admitting: Student

## 2018-08-18 DIAGNOSIS — B20 Human immunodeficiency virus [HIV] disease: Secondary | ICD-10-CM | POA: Insufficient documentation

## 2018-08-18 DIAGNOSIS — L02214 Cutaneous abscess of groin: Secondary | ICD-10-CM | POA: Insufficient documentation

## 2018-08-18 DIAGNOSIS — L0291 Cutaneous abscess, unspecified: Secondary | ICD-10-CM

## 2018-08-18 DIAGNOSIS — F17210 Nicotine dependence, cigarettes, uncomplicated: Secondary | ICD-10-CM | POA: Insufficient documentation

## 2018-08-18 MED ORDER — DOXYCYCLINE HYCLATE 100 MG PO CAPS: 100.0000 mg | ORAL_CAPSULE | Freq: Two times a day (BID) | ORAL | 0 refills | Status: DC

## 2018-08-18 MED ORDER — LIDOCAINE HCL (PF) 1 % IJ SOLN
5.0000 mL | Freq: Once | INTRAMUSCULAR | Status: AC
  Administered 2018-08-18: 5 mL
  Filled 2018-08-18: qty 5

## 2018-08-18 NOTE — Discharge Instructions (Addendum)
You were seen in the emergency department for a skin abscess- please see the attached handout for further information regarding this diagnoses. This area was incised and drained to help release the bacteria. We would like you to apply warm compresses and warm flushes to this area 4-5 times per day to help facilitate further draining as needed. We are also starting you on doxycycline, an antibiotic, in order to help treat the infection.   We have prescribed you new medication(s) today. Discuss the medications prescribed today with your pharmacist as they can have adverse effects and interactions with your other medicines including over the counter and prescribed medications. Seek medical evaluation if you start to experience new or abnormal symptoms after taking one of these medicines, seek care immediately if you start to experience difficulty breathing, feeling of your throat closing, facial swelling, or rash as these could be indications of a more serious allergic reaction  We would like you to have this area rechecked within 48 hours- please return to the ER , go to an urgent care, or see your primary care provider for this. Return to the ER sooner for new or worsening symptoms including, but not limited to increased pain, spreading redness, fevers, inability to keep fluids down, or any other concerns that you may have.   We also tested you for gonorrhea, chlamydia, and syphilis while you are present in the emergency department.  If positive we will call you with these test results.  If positive you will need to seek care at the health department or with your infectious disease doctor and inform all sexual partners.

## 2018-08-18 NOTE — ED Triage Notes (Signed)
Onset 1 week ago 2 lumps on right groin.  No increase in size, but pain is worsening.  Clear penile discharge.

## 2018-08-18 NOTE — ED Notes (Signed)
Patient verbalizes understanding of discharge instructions . Opportunity for questions and answers were provided . Armband removed by staff ,Pt discharged from ED. W/C  offered at D/C  and Declined W/C at D/C and was escorted to lobby by RN.  

## 2018-08-18 NOTE — ED Provider Notes (Signed)
[facility] EMERGENCY DEPARTMENT Provider Note   CSN: [id number] Arrival date & time: 08/18/18  1558    History   Chief Complaint Chief Complaint  Patient presents with  . Mass    HPI Eric Bass is a 23 y.o. male with a hx of HIV - currently well controlled on his medications that presents to the ER with concern for painful mass to the R groin x 1 week. Progressively worsening in pain, no change in size. Worse with palpation and movement at the groin, no alleviating factors. Tried tylenol PM without relief. No drainage from the area. When asked about penile discharge he does mention that he had some clear discharge when he was aroused a couple of days ago, no green/yellow content, no re-occurrence. Sexually active w/ 2 partners w/ use of protection. Denies fever, chills, dysuria, frequency, urgency, pain w/ bowel movements, or testicular pain/swelling.      HPI  Past Medical History:  Diagnosis Date  . HIV (human immunodeficiency virus infection) [facility])     Patient Active Problem List   Diagnosis Date Noted  . HIV disease (HCC) 03/10/2016  . Syphilis 03/10/2016  . ATTENTION DEFICIT DISORDER 02/17/2008    History reviewed. No pertinent surgical history.      Home Medications    Prior to Admission medications   Medication Sig Start Date End Date Taking? Authorizing Provider  GENVOYA 150-150-200-10 MG TABS tablet TAKE 1 TABLET BY MOUTH EVERY DAY WITH BREAKFAST 07/29/18   Ginnie Smart, MD    Family History Family History  Problem Relation Age of Onset  . Hypertension Mother   . Diabetes Maternal Grandmother   . Hypertension Paternal Grandmother   . Tuberculosis Paternal Grandmother   . Diabetes Paternal Grandfather   . Tuberculosis Paternal Grandfather     Social History Social History   Tobacco Use  . Smoking status: Current Every Day Smoker    Packs/day: 1.00    Types: Cigarettes    Start date: 05/30/1991  . Smokeless tobacco:  Never Used  . Tobacco comment: not interested in quitting  Substance Use Topics  . Alcohol use: Yes    Alcohol/week: 4.0 standard drinks    Types: 4 Shots of liquor per week    Comment: socially  . Drug use: Yes    Types: Marijuana    Comment: daily     Allergies   Patient has no known allergies.   Review of Systems Review of Systems  Constitutional: Negative for chills and fever.  Respiratory: Negative for shortness of breath.   Cardiovascular: Negative for chest pain.  Gastrointestinal: Negative for abdominal pain, anal bleeding, blood in stool, constipation, diarrhea, nausea, rectal pain and vomiting.  Genitourinary: Positive for discharge. Negative for dysuria, frequency, penile pain, penile swelling, scrotal swelling, testicular pain and urgency.  Skin: Positive for wound (painful mass to R groin).  All other systems reviewed and are negative.    Physical Exam Updated Vital Signs BP (!) 144/77 (BP Location: Right Arm)   Pulse 73   Temp 98.7 F (37.1 C) (Oral)   Resp 16   SpO2 100%   Physical Exam Vitals signs and nursing note reviewed. Exam conducted with a chaperone present.  Constitutional:      General: He is not in acute distress.    Appearance: He is well-developed. He is not toxic-appearing.  HENT:     Head: Normocephalic and atraumatic.  Eyes:     General:  Right eye: No discharge.        Left eye: No discharge.     Conjunctiva/sclera: Conjunctivae normal.  Neck:     Musculoskeletal: Neck supple.  Cardiovascular:     Rate and Rhythm: Normal rate and regular rhythm.  Pulmonary:     Effort: Pulmonary effort is normal. No respiratory distress.     Breath sounds: Normal breath sounds. No wheezing, rhonchi or rales.  Abdominal:     General: There is no distension.     Palpations: Abdomen is soft.     Tenderness: There is no abdominal tenderness.  Genitourinary:    Penis: Circumcised. No erythema, tenderness, discharge, swelling or lesions.       Scrotum/Testes: Normal. Cremasteric reflex is present.     Epididymis:     Right: No tenderness.     Left: No tenderness.     Comments: R groin: there is an area of induration to the R groin crease which extends to the R buttocks. There is a central area of fluctuance with mild erythema that measures approximately 2 cm in diameter. Fluctuant area is tender to palpation, otherwise nontender. No peri-anal tenderness/fluctuance. No gangrenous skin changes.  Skin:    General: Skin is warm and dry.     Findings: No rash.  Neurological:     Mental Status: He is alert.     Comments: Clear speech.   Psychiatric:        Behavior: Behavior normal.      ED Treatments / Results  Labs (all labs ordered are listed, but only abnormal results are displayed) Labs Reviewed  RPR  GC/CHLAMYDIA PROBE AMP ([place]) NOT AT [facility]    EKG None  Radiology No results found.  Procedures  EMERGENCY DEPARTMENT US SOFT TISSUE INTERPRETATION "Study: Limited Soft Tissue Ultrasound"  INDICATIONS: Soft tissue infection Multiple views of the body part were obtained in real-time with a multi-frequency linear probe  PERFORMED BY: Myself IMAGES ARCHIVED?: No SIDE:Right  BODY PART:groin INTERPRETATION:  Abcess present   .Marland KitchenIncision and Drainage Date/Time: 08/18/2018 5:03 PM Performed by: Cherly Anderson, PA-C Authorized by: Cherly Anderson, PA-C   Consent:    Consent obtained:  Verbal   Consent given by:  Patient   Risks discussed:  Bleeding, damage to other organs, infection, incomplete drainage and pain   Alternatives discussed:  No treatment Location:    Type:  Abscess   Size:  2   Location: R groin. Pre-procedure details:    Skin preparation:  Betadine Anesthesia (see MAR for exact dosages):    Anesthesia method:  Local infiltration   Local anesthetic:  Lidocaine 1% w/o epi Procedure type:    Complexity:  Simple Procedure details:    Incision types:  Stab incision    Scalpel blade:  11   Wound management:  Probed and deloculated, irrigated with saline and extensive cleaning   Drainage:  Bloody and purulent   Drainage amount:  Moderate   Wound treatment:  Wound left open   Packing materials:  None Post-procedure details:    Patient tolerance of procedure:  Tolerated well, no immediate complications   (including critical care time)      Medications Ordered in ED Medications  lidocaine (PF) (XYLOCAINE) 1 % injection 5 mL (5 mLs Infiltration Given 08/18/18 1629)     Initial Impression / Assessment and Plan / ED Course  I have reviewed the triage vital signs and the nursing notes.  Pertinent labs & imaging results that were  available during my care of the patient were reviewed by me and considered in my medical decision making (see chart for details).    Patient presents to the ED with abscess to R groin amenable to I&D based on exam confirmed on bedside US. Does not extend to rectum. Does not extend to scrotum/penis. Procedure per note above. Abscess cavity did not seem large enough to warrant packing/drain placement. Given mild surrounding cellulitis & hx of HIV (currently well controlled) will start patient on Doxycycline. Recommended application of warm compresses/soaks/flushing. Will have patient return for wound recheck in 2 days. Patient mentioned some penile discharge a few days prior, clear when sexually aroused, will test for GC/chlamydia & RPR, discussed protection, health department follow up, and need to seek tx/inform all sexual partners if positive. I discussed treatment plan, need for follow-up, and return precautions with the patient. Provided opportunity for questions, patient confirmed understanding and is in agreement with plan.    Findings and plan of care discussed with supervising physician Dr. Rosalia Hammers who evaluated patient & is in agreement.   Final Clinical Impressions(s) / ED Diagnoses   Final diagnoses:  Abscess    ED  Discharge Orders         Ordered    doxycycline (VIBRAMYCIN) 100 MG capsule  2 times daily     08/18/18 [address], [place], PA-C 08/18/18 1758    Margarita Grizzle, MD 08/24/18 [phone]

## 2018-08-19 LAB — GC/CHLAMYDIA PROBE AMP (~~LOC~~) NOT AT ARMC
Chlamydia: NEGATIVE
Neisseria Gonorrhea: NEGATIVE

## 2018-08-20 LAB — RPR: RPR Ser Ql: REACTIVE — AB

## 2018-08-20 LAB — RPR, QUANT+TP ABS (REFLEX)
Rapid Plasma Reagin, Quant: 1:2 {titer} — ABNORMAL HIGH
T Pallidum Abs: REACTIVE — AB

## 2018-09-12 ENCOUNTER — Other Ambulatory Visit: Payer: Self-pay

## 2018-09-12 ENCOUNTER — Emergency Department (HOSPITAL_COMMUNITY)
Admission: EM | Admit: 2018-09-12 | Discharge: 2018-09-12 | Disposition: A | Discharge status: Home / Self Care | Payer: Self-pay | Attending: Emergency Medicine | Admitting: Emergency Medicine

## 2018-09-12 ENCOUNTER — Encounter (HOSPITAL_COMMUNITY): Payer: Self-pay

## 2018-09-12 DIAGNOSIS — L0291 Cutaneous abscess, unspecified: Secondary | ICD-10-CM

## 2018-09-12 DIAGNOSIS — L02214 Cutaneous abscess of groin: Secondary | ICD-10-CM | POA: Insufficient documentation

## 2018-09-12 DIAGNOSIS — F909 Attention-deficit hyperactivity disorder, unspecified type: Secondary | ICD-10-CM | POA: Insufficient documentation

## 2018-09-12 DIAGNOSIS — Z79899 Other long term (current) drug therapy: Secondary | ICD-10-CM | POA: Insufficient documentation

## 2018-09-12 DIAGNOSIS — F17210 Nicotine dependence, cigarettes, uncomplicated: Secondary | ICD-10-CM | POA: Insufficient documentation

## 2018-09-12 MED ORDER — LIDOCAINE-EPINEPHRINE (PF) 2 %-1:200000 IJ SOLN
20.0000 mL | Freq: Once | INTRAMUSCULAR | Status: AC
  Administered 2018-09-12: 20 mL
  Filled 2018-09-12: qty 20

## 2018-09-12 MED ORDER — SULFAMETHOXAZOLE-TRIMETHOPRIM 800-160 MG PO TABS: 1.0000 | ORAL_TABLET | Freq: Two times a day (BID) | ORAL | 0 refills | Status: AC

## 2018-09-12 NOTE — ED Triage Notes (Signed)
Pt reports recurring boil/abcess in right groin.  Pt states its been present for 2 weeks and became red and painful yesterday.  Pt has tried warm compress with no relief.  Pt denies fever, SOB, or any UR symptoms.

## 2018-09-12 NOTE — ED Provider Notes (Signed)
[facility] EMERGENCY DEPARTMENT Provider Note   CSN: [id number] Arrival date & time: 09/12/18  1148    History   Chief Complaint Chief Complaint  Patient presents with  . Recurrent Skin Infections    HPI Eric Bass is a 23 y.o. male.     HPI   23 year old male presents today with complaints of abscess.  Patient notes he was seen on 08/18/2018 for an abscess in the same location.  He had an I&D performed at that time.  He was put on antibiotics.  He no symptoms improved, but again worsened over the last several days.  He notes swelling pain at the right groin region.  He denies any fever.  Past Medical History:  Diagnosis Date  . HIV (human immunodeficiency virus infection) [facility])     Patient Active Problem List   Diagnosis Date Noted  . HIV disease (HCC) 03/10/2016  . Syphilis 03/10/2016  . ATTENTION DEFICIT DISORDER 02/17/2008    History reviewed. No pertinent surgical history.      Home Medications    Prior to Admission medications   Medication Sig Start Date End Date Taking? Authorizing Provider  GENVOYA 150-150-200-10 MG TABS tablet TAKE 1 TABLET BY MOUTH EVERY DAY WITH BREAKFAST Patient taking differently: Take 1 tablet by mouth daily with breakfast.  07/29/18  Yes Ginnie Smart, MD  sulfamethoxazole-trimethoprim (BACTRIM DS,SEPTRA DS) 800-160 MG tablet Take 1 tablet by mouth 2 (two) times daily for 7 days. 09/12/18 09/19/18  Eyvonne Mechanic, PA-C    Family History Family History  Problem Relation Age of Onset  . Hypertension Mother   . Diabetes Maternal Grandmother   . Hypertension Paternal Grandmother   . Tuberculosis Paternal Grandmother   . Diabetes Paternal Grandfather   . Tuberculosis Paternal Grandfather     Social History Social History   Tobacco Use  . Smoking status: Current Every Day Smoker    Packs/day: 1.00    Types: Cigarettes    Start date: 05/30/1991  . Smokeless tobacco: Never Used  . Tobacco comment: not  interested in quitting  Substance Use Topics  . Alcohol use: Yes    Alcohol/week: 4.0 standard drinks    Types: 4 Shots of liquor per week    Comment: socially  . Drug use: Yes    Types: Marijuana    Comment: daily     Allergies   Patient has no known allergies.   Review of Systems Review of Systems  All other systems reviewed and are negative.    Physical Exam Updated Vital Signs BP 112/84 (BP Location: Right Arm)   Pulse 70   Temp 98 F (36.7 C)   Resp 16   Ht 6\' 1"  (1.854 m)   Wt 73 kg   SpO2 100%   BMI 21.24 kg/m   Physical Exam Vitals signs and nursing note reviewed.  Constitutional:      Appearance: He is well-developed.  HENT:     Head: Normocephalic and atraumatic.  Eyes:     General: No scleral icterus.       Right eye: No discharge.        Left eye: No discharge.     Conjunctiva/sclera: Conjunctivae normal.     Pupils: Pupils are equal, round, and reactive to light.  Neck:     Musculoskeletal: Normal range of motion.     Vascular: No JVD.     Trachea: No tracheal deviation.  Pulmonary:     Effort: Pulmonary effort  is normal.     Breath sounds: No stridor.  Skin:    Comments: 2 cm abscess to the right groin, central fluctuance noted no significant overlying redness  Neurological:     Mental Status: He is alert and oriented to person, place, and time.     Coordination: Coordination normal.  Psychiatric:        Behavior: Behavior normal.        Thought Content: Thought content normal.        Judgment: Judgment normal.    Chaperone present  ED Treatments / Results  Labs (all labs ordered are listed, but only abnormal results are displayed) Labs Reviewed - No data to display  EKG None  Radiology No results found.  Procedures .Marland Kitchen.Incision and Drainage Date/Time: 09/12/2018 1:30 PM Performed by: Eyvonne MechanicHedges, Caitlyne Ingham, PA-C Authorized by: Eyvonne MechanicHedges, Cortney Mckinney, PA-C   Consent:    Consent obtained:  Verbal   Consent given by:  Patient   Risks  discussed:  Bleeding, damage to other organs, infection and incomplete drainage   Alternatives discussed:  No treatment and alternative treatment Location:    Type:  Abscess   Size:  2 Anesthesia (see MAR for exact dosages):    Anesthesia method:  Local infiltration   Local anesthetic:  Lidocaine 2% WITH epi Procedure type:    Complexity:  Simple Procedure details:    Incision types:  Stab incision and single straight   Incision depth:  Dermal   Scalpel blade:  11   Wound management:  Probed and deloculated   Drainage:  Purulent   Drainage amount:  Moderate   Wound treatment:  Wound left open   Packing materials:  None Post-procedure details:    Patient tolerance of procedure:  Tolerated well, no immediate complications   (including critical care time)  Medications Ordered in ED Medications  lidocaine-EPINEPHrine (XYLOCAINE W/EPI) 2 %-1:200000 (PF) injection 20 mL (has no administration in time range)     Initial Impression / Assessment and Plan / ED Course  I have reviewed the triage vital signs and the nursing notes.  Pertinent labs & imaging results that were available during my care of the patient were reviewed by me and considered in my medical decision making (see chart for details).         Discharge Meds: Bactrim  Assessment/Plan: 23 year old male presents today with abscess.  I&D successful here.  This is reoccurring, I will have him follow-up with general surgery for repeat evaluation for reoccurring abscess.  Patient given strict return precautions, he verbalized understanding and agreement to today's plan had no further questions or concerns.    Final Clinical Impressions(s) / ED Diagnoses   Final diagnoses:  Abscess    ED Discharge Orders         Ordered    sulfamethoxazole-trimethoprim (BACTRIM DS,SEPTRA DS) 800-160 MG tablet  2 times daily     09/12/18 1327           Eyvonne MechanicHedges, Randalyn Ahmed, PA-C 09/12/18 1331    Cathren LaineSteinl, Kevin, MD 09/12/18 1907

## 2018-09-12 NOTE — ED Notes (Signed)
Patient verbalizes understanding of discharge instructions. Opportunity for questioning and answers were provided. Armband removed by staff, pt discharged from ED.  

## 2018-09-12 NOTE — Discharge Instructions (Addendum)
Please read attached information. If you experience any new or worsening signs or symptoms please return to the emergency room for evaluation. Please follow-up with your primary care provider or specialist as discussed. Please use medication prescribed only as directed and discontinue taking if you have any concerning signs or symptoms.   °

## 2018-11-10 ENCOUNTER — Encounter (HOSPITAL_COMMUNITY): Payer: Self-pay

## 2018-11-10 ENCOUNTER — Emergency Department (HOSPITAL_COMMUNITY)
Admission: EM | Admit: 2018-11-10 | Discharge: 2018-11-10 | Disposition: A | Discharge status: Home / Self Care | Payer: HRSA Program | Attending: Emergency Medicine | Admitting: Emergency Medicine

## 2018-11-10 DIAGNOSIS — Z20828 Contact with and (suspected) exposure to other viral communicable diseases: Secondary | ICD-10-CM | POA: Diagnosis not present

## 2018-11-10 DIAGNOSIS — Z21 Asymptomatic human immunodeficiency virus [HIV] infection status: Secondary | ICD-10-CM | POA: Diagnosis not present

## 2018-11-10 DIAGNOSIS — B349 Viral infection, unspecified: Secondary | ICD-10-CM | POA: Diagnosis not present

## 2018-11-10 DIAGNOSIS — F17210 Nicotine dependence, cigarettes, uncomplicated: Secondary | ICD-10-CM | POA: Insufficient documentation

## 2018-11-10 DIAGNOSIS — R509 Fever, unspecified: Secondary | ICD-10-CM | POA: Diagnosis present

## 2018-11-10 NOTE — ED Notes (Signed)
Patient verbalizes understanding of discharge instructions. Opportunity for questioning and answers were provided. Armband removed by staff, pt discharged from ED.  

## 2018-11-10 NOTE — ED Triage Notes (Signed)
Pt states that he has had a fever all day at home, report 102, took advil for this,some belly pain and dry mouth, denies cough

## 2018-11-10 NOTE — Discharge Instructions (Addendum)
Please stay at home until you hear about your covid 19 test results. Follow up with your PCP as well. Return to the ED for any worsening symptoms including shortness of breath, coughing up blood.      Person Under Monitoring Name: Eric Bass  [place] [address] [place] [id number]   Infection Prevention Recommendations for Individuals Confirmed to have, or Being Evaluated for, 2019 Novel Coronavirus (COVID-19) Infection Who Receive Care at Home  Individuals who are confirmed to have, or are being evaluated for, COVID-19 should follow the prevention steps below until a healthcare provider or local or state health department says they can return to normal activities.  Stay home except to get medical care You should restrict activities outside your home, except for getting medical care. Do not go to work, school, or public areas, and do not use public transportation or taxis.  Call ahead before visiting your doctor Before your medical appointment, call the healthcare provider and tell them that you have, or are being evaluated for, COVID-19 infection. This will help the healthcare providers office take steps to keep other people from getting infected. Ask your healthcare provider to call the local or state health department.  Monitor your symptoms Seek prompt medical attention if your illness is worsening (e.g., difficulty breathing). Before going to your medical appointment, call the healthcare provider and tell them that you have, or are being evaluated for, COVID-19 infection. Ask your healthcare provider to call the local or state health department.  Wear a facemask You should wear a facemask that covers your nose and mouth when you are in the same room with other people and when you visit a healthcare provider. People who live with or visit you should also wear a facemask while they are in the same room with you.  Separate yourself from other people in your  home As much as possible, you should stay in a different room from other people in your home. Also, you should use a separate bathroom, if available.  Avoid sharing household items You should not share dishes, drinking glasses, cups, eating utensils, towels, bedding, or other items with other people in your home. After using these items, you should wash them thoroughly with soap and water.  Cover your coughs and sneezes Cover your mouth and nose with a tissue when you cough or sneeze, or you can cough or sneeze into your sleeve. Throw used tissues in a lined trash can, and immediately wash your hands with soap and water for at least 20 seconds or use an alcohol-based hand rub.  Wash your Union Pacific Corporationhands Wash your hands often and thoroughly with soap and water for at least 20 seconds. You can use an alcohol-based hand sanitizer if soap and water are not available and if your hands are not visibly dirty. Avoid touching your eyes, nose, and mouth with unwashed hands.   Prevention Steps for Caregivers and Household Members of Individuals Confirmed to have, or Being Evaluated for, COVID-19 Infection Being Cared for in the Home  If you live with, or provide care at home for, a person confirmed to have, or being evaluated for, COVID-19 infection please follow these guidelines to prevent infection:  Follow healthcare providers instructions Make sure that you understand and can help the patient follow any healthcare provider instructions for all care.  Provide for the patients basic needs You should help the patient with basic needs in the home and provide support for getting groceries, prescriptions, and other personal  needs.  Monitor the patients symptoms If they are getting sicker, call his or her medical provider and tell them that the patient has, or is being evaluated for, COVID-19 infection. This will help the healthcare providers office take steps to keep other people from getting  infected. Ask the healthcare provider to call the local or state health department.  Limit the number of people who have contact with the patient If possible, have only one caregiver for the patient. Other household members should stay in another home or place of residence. If this is not possible, they should stay in another room, or be separated from the patient as much as possible. Use a separate bathroom, if available. Restrict visitors who do not have an essential need to be in the home.  Keep older adults, very young children, and other sick people away from the patient Keep older adults, very young children, and those who have compromised immune systems or chronic health conditions away from the patient. This includes people with chronic heart, lung, or kidney conditions, diabetes, and cancer.  Ensure good ventilation Make sure that shared spaces in the home have good air flow, such as from an air conditioner or an opened window, weather permitting.  Wash your hands often Wash your hands often and thoroughly with soap and water for at least 20 seconds. You can use an alcohol based hand sanitizer if soap and water are not available and if your hands are not visibly dirty. Avoid touching your eyes, nose, and mouth with unwashed hands. Use disposable paper towels to dry your hands. If not available, use dedicated cloth towels and replace them when they become wet.  Wear a facemask and gloves Wear a disposable facemask at all times in the room and gloves when you touch or have contact with the patients blood, body fluids, and/or secretions or excretions, such as sweat, saliva, sputum, nasal mucus, vomit, urine, or feces.  Ensure the mask fits over your nose and mouth tightly, and do not touch it during use. Throw out disposable facemasks and gloves after using them. Do not reuse. Wash your hands immediately after removing your facemask and gloves. If your personal clothing becomes  contaminated, carefully remove clothing and launder. Wash your hands after handling contaminated clothing. Place all used disposable facemasks, gloves, and other waste in a lined container before disposing them with other household waste. Remove gloves and wash your hands immediately after handling these items.  Do not share dishes, glasses, or other household items with the patient Avoid sharing household items. You should not share dishes, drinking glasses, cups, eating utensils, towels, bedding, or other items with a patient who is confirmed to have, or being evaluated for, COVID-19 infection. After the person uses these items, you should wash them thoroughly with soap and water.  Wash laundry thoroughly Immediately remove and wash clothes or bedding that have blood, body fluids, and/or secretions or excretions, such as sweat, saliva, sputum, nasal mucus, vomit, urine, or feces, on them. Wear gloves when handling laundry from the patient. Read and follow directions on labels of laundry or clothing items and detergent. In general, wash and dry with the warmest temperatures recommended on the label.  Clean all areas the individual has used often Clean all touchable surfaces, such as counters, tabletops, doorknobs, bathroom fixtures, toilets, phones, keyboards, tablets, and bedside tables, every day. Also, clean any surfaces that may have blood, body fluids, and/or secretions or excretions on them. Wear gloves when cleaning surfaces the  patient has come in contact with. Use a diluted bleach solution (e.g., dilute bleach with 1 part bleach and 10 parts water) or a household disinfectant with a label that says EPA-registered for coronaviruses. To make a bleach solution at home, add 1 tablespoon of bleach to 1 quart (4 cups) of water. For a larger supply, add  cup of bleach to 1 gallon (16 cups) of water. Read labels of cleaning products and follow recommendations provided on product labels. Labels  contain instructions for safe and effective use of the cleaning product including precautions you should take when applying the product, such as wearing gloves or eye protection and making sure you have good ventilation during use of the product. Remove gloves and wash hands immediately after cleaning.  Monitor yourself for signs and symptoms of illness Caregivers and household members are considered close contacts, should monitor their health, and will be asked to limit movement outside of the home to the extent possible. Follow the monitoring steps for close contacts listed on the symptom monitoring form.   ? If you have additional questions, contact your local health department or call the epidemiologist on call at [phone] (available 24/7). ? This guidance is subject to change. For the most up-to-date guidance from [facility], please refer to their website: [email or web address]

## 2018-11-10 NOTE — ED Provider Notes (Signed)
[name]: [id number] Arrival date & time: 11/10/18  1916    History   Chief Complaint Chief Complaint  Patient presents with  . Fever    HPI Eric Bass is a 23 y.o. male who presents to the ED for fever that began today, tmax 102. Pt reports he took some Aleve PTA; he is afebrile in the ED. Pt is concerned he could have covid 19 given symptoms; he works at [employer] and was also at a gathering yesterday where social distancing was not happening. Pt has hx of HIV and is concerned given his immunocompromised state. Reports his viral load has been undetectable and he is compliant with his medications. Denies cough, shortness of breath, headache, nausea, vomiting, sore throat, ear pain, sinus pressure.        Past Medical History:  Diagnosis Date  . HIV (human immunodeficiency virus infection) [facility](HCC)     Patient Active Problem List   Diagnosis Date Noted  . HIV disease (HCC) 03/10/2016  . Syphilis 03/10/2016  . ATTENTION DEFICIT DISORDER 02/17/2008    History reviewed. No pertinent surgical history.      Home Medications    Prior to Admission medications   Medication Sig Start Date End Date Taking? Authorizing Provider  GENVOYA 150-150-200-10 MG TABS tablet TAKE 1 TABLET BY MOUTH EVERY DAY WITH BREAKFAST Patient taking differently: Take 1 tablet by mouth daily with breakfast.  07/29/18   Ginnie SmartHatcher, Jeffrey C, MD    Family History Family History  Problem Relation Age of Onset  . Hypertension Mother   . Diabetes Maternal Grandmother   . Hypertension Paternal Grandmother   . Tuberculosis Paternal Grandmother   . Diabetes Paternal Grandfather   . Tuberculosis Paternal Grandfather     Social History Social History   Tobacco Use  . Smoking status: Current Every Day Smoker    Packs/day: 1.00    Types: Cigarettes    Start date: 05/30/1991  . Smokeless tobacco: Never Used  . Tobacco comment: not interested in  quitting  Substance Use Topics  . Alcohol use: Yes    Alcohol/week: 4.0 standard drinks    Types: 4 Shots of liquor per week    Comment: socially  . Drug use: Yes    Types: Marijuana    Comment: daily     Allergies   Patient has no known allergies.   Review of Systems Review of Systems  Constitutional: Positive for fever. Negative for chills.  HENT: Negative for congestion, ear pain, rhinorrhea, sinus pressure, sinus pain and sore throat.   Respiratory: Negative for cough and shortness of breath.   Gastrointestinal: Negative for abdominal pain, nausea and vomiting.     Physical Exam Updated Vital Signs BP 110/73   Pulse 92   Temp 99.7 F (37.6 C) (Oral)   Resp 18   SpO2 100%   Physical Exam Vitals signs and nursing note reviewed.  Constitutional:      Appearance: He is not ill-appearing.  HENT:     Head: Normocephalic and atraumatic.     Right Ear: Tympanic membrane normal.     Left Ear: Tympanic membrane normal.     Nose: Nose normal.     Mouth/Throat:     Mouth: Mucous membranes are moist.     Pharynx: No oropharyngeal exudate or posterior oropharyngeal erythema.  Eyes:     General:        Right eye: No discharge.  Left eye: No discharge.     Conjunctiva/sclera: Conjunctivae normal.  Cardiovascular:     Rate and Rhythm: Normal rate and regular rhythm.     Pulses: Normal pulses.  Pulmonary:     Effort: Pulmonary effort is normal.     Breath sounds: Normal breath sounds. No wheezing, rhonchi or rales.  Abdominal:     General: Abdomen is flat.     Tenderness: There is no abdominal tenderness. There is no guarding or rebound.  Lymphadenopathy:     Cervical: No cervical adenopathy.  Skin:    General: Skin is warm and dry.     Coloration: Skin is not jaundiced.  Neurological:     Mental Status: He is alert.      ED Treatments / Results  Labs (all labs ordered are listed, but only abnormal results are displayed) Labs Reviewed  NOVEL  CORONAVIRUS, NAA (HOSPITAL ORDER, SEND-OUT TO REF LAB)    EKG None  Radiology No results found.  Procedures Procedures (including critical care time)  Medications Ordered in ED Medications - No data to display   Initial Impression / Assessment and Plan / ED Course  I have reviewed the triage vital signs and the nursing notes.  Pertinent labs & imaging results that were available during my care of the patient were reviewed by me and considered in my medical decision making (see chart for details).    Pt is a 23 year old male with hx HIV presenting for a fever tmax 102 today; afebrile in the ED at 99.9 but took Aleve PTA. Concerned about covid 19 today; no known exposures but he does work at [employer]  and people have been coming in to eat recently. No other concerning symptoms today including headache; neck stiffness; rash; sore throat; ear pain; URI symptoms; cough; SOB. Given pts immunocompromised state will order send out covid 19 lab. Pt advised to stay at home until he hears about his results. He is in agreement with plan at this time and stable for discharge home.   Eric Bass was evaluated in Emergency Department on 11/10/2018 for the symptoms described in the history of present illness. He was evaluated in the context of the global COVID-19 pandemic, which necessitated consideration that the patient might be at risk for infection with the SARS-CoV-2 virus that causes COVID-19. Institutional protocols and algorithms that pertain to the evaluation of patients at risk for COVID-19 are in a state of rapid change based on information released by regulatory bodies including the CDC and federal and state organizations. These policies and algorithms were followed during the patient's care in the ED.        Final Clinical Impressions(s) / ED Diagnoses   Final diagnoses:  Viral illness    ED Discharge Orders    None       Eustaquio Maize, PA-C 11/10/18 2257    Drenda Freeze, MD 11/13/18 [phone]

## 2018-11-12 LAB — NOVEL CORONAVIRUS, NAA (HOSP ORDER, SEND-OUT TO REF LAB; TAT 18-24 HRS): SARS-CoV-2, NAA: NOT DETECTED

## 2018-12-09 ENCOUNTER — Ambulatory Visit: Payer: Self-pay

## 2018-12-09 ENCOUNTER — Other Ambulatory Visit: Payer: Self-pay

## 2018-12-09 DIAGNOSIS — Z113 Encounter for screening for infections with a predominantly sexual mode of transmission: Secondary | ICD-10-CM

## 2018-12-09 DIAGNOSIS — Z79899 Other long term (current) drug therapy: Secondary | ICD-10-CM

## 2018-12-09 DIAGNOSIS — B20 Human immunodeficiency virus [HIV] disease: Secondary | ICD-10-CM

## 2018-12-10 LAB — URINE CYTOLOGY ANCILLARY ONLY
Chlamydia: NEGATIVE
Neisseria Gonorrhea: NEGATIVE

## 2018-12-10 LAB — T-HELPER CELL (CD4) - (RCID CLINIC ONLY)
CD4 % Helper T Cell: 32 % — ABNORMAL LOW (ref 33–65)
CD4 T Cell Abs: 594 /uL (ref 400–1790)

## 2018-12-11 ENCOUNTER — Telehealth: Payer: Self-pay | Admitting: *Deleted

## 2018-12-11 NOTE — Telephone Encounter (Signed)
Per Eric Bass called patient to inform of his +RPR. He did not answer the phone and his mailbox was not set up so I was unable to leave a message for him. Will have to wait for him to call the office.     Campbell Riches, MD  P Rcid Triage Nurse Pool        Pt needs PEN G 2.4 million units IM x1. For syphillis.  No allergies listed.  thanks

## 2018-12-12 ENCOUNTER — Ambulatory Visit (INDEPENDENT_AMBULATORY_CARE_PROVIDER_SITE_OTHER): Payer: Self-pay | Admitting: *Deleted

## 2018-12-12 ENCOUNTER — Other Ambulatory Visit: Payer: Self-pay

## 2018-12-12 DIAGNOSIS — A539 Syphilis, unspecified: Secondary | ICD-10-CM

## 2018-12-12 LAB — COMPREHENSIVE METABOLIC PANEL
AG Ratio: 1 (calc) (ref 1.0–2.5)
ALT: 4 U/L — ABNORMAL LOW (ref 9–46)
AST: 23 U/L (ref 10–40)
Albumin: 4 g/dL (ref 3.6–5.1)
Alkaline phosphatase (APISO): 93 U/L (ref 36–130)
BUN: 11 mg/dL (ref 7–25)
CO2: 21 mmol/L (ref 20–32)
Calcium: 9.5 mg/dL (ref 8.6–10.3)
Chloride: 109 mmol/L (ref 98–110)
Creat: 1.13 mg/dL (ref 0.60–1.35)
Globulin: 3.9 g/dL (calc) — ABNORMAL HIGH (ref 1.9–3.7)
Glucose, Bld: 92 mg/dL (ref 65–99)
Potassium: 4.6 mmol/L (ref 3.5–5.3)
Sodium: 142 mmol/L (ref 135–146)
Total Bilirubin: 0.4 mg/dL (ref 0.2–1.2)
Total Protein: 7.9 g/dL (ref 6.1–8.1)

## 2018-12-12 LAB — CBC
HCT: 46.3 % (ref 38.5–50.0)
Hemoglobin: 15.8 g/dL (ref 13.2–17.1)
MCH: 32.6 pg (ref 27.0–33.0)
MCHC: 34.1 g/dL (ref 32.0–36.0)
MCV: 95.7 fL (ref 80.0–100.0)
MPV: 11.8 fL (ref 7.5–12.5)
Platelets: 231 10*3/uL (ref 140–400)
RBC: 4.84 10*6/uL (ref 4.20–5.80)
RDW: 12.4 % (ref 11.0–15.0)
WBC: 4.2 10*3/uL (ref 3.8–10.8)

## 2018-12-12 LAB — LIPID PANEL
Cholesterol: 187 mg/dL (ref <200)
HDL: 55 mg/dL (ref >=40)
LDL Cholesterol (Calc): 108 mg/dL (calc) — ABNORMAL HIGH
Non-HDL Cholesterol (Calc): 132 mg/dL (calc) — ABNORMAL HIGH (ref <130)
Total CHOL/HDL Ratio: 3.4 (calc) (ref <5.0)
Triglycerides: 128 mg/dL (ref <150)

## 2018-12-12 LAB — RPR: RPR Ser Ql: REACTIVE — AB

## 2018-12-12 LAB — FLUORESCENT TREPONEMAL AB(FTA)-IGG-BLD: Fluorescent Treponemal ABS: REACTIVE — AB

## 2018-12-12 LAB — HIV-1 RNA QUANT-NO REFLEX-BLD
HIV 1 RNA Quant: 49 copies/mL — ABNORMAL HIGH
HIV-1 RNA Quant, Log: 1.69 Log copies/mL — ABNORMAL HIGH

## 2018-12-12 LAB — RPR TITER: RPR Titer: 1:64 {titer} — ABNORMAL HIGH

## 2018-12-12 MED ORDER — PENICILLIN G BENZATHINE 1200000 UNIT/2ML IM SUSP
1.2000 10*6.[IU] | Freq: Once | INTRAMUSCULAR | Status: AC
  Administered 2018-12-12: 1.2 10*6.[IU] via INTRAMUSCULAR

## 2018-12-12 MED ORDER — PENICILLIN G BENZATHINE 1200000 UNIT/2ML IM SUSP
1.2000 10*6.[IU] | Freq: Once | INTRAMUSCULAR | Status: AC
  Administered 2018-12-12: 14:00:00 1.2 10*6.[IU] via INTRAMUSCULAR

## 2018-12-13 ENCOUNTER — Encounter: Payer: Self-pay | Admitting: Infectious Diseases

## 2018-12-16 ENCOUNTER — Ambulatory Visit: Payer: Self-pay

## 2018-12-24 ENCOUNTER — Ambulatory Visit: Payer: Self-pay | Admitting: Infectious Diseases

## 2018-12-27 ENCOUNTER — Encounter: Payer: Medicaid Other | Admitting: Infectious Diseases

## 2019-03-25 ENCOUNTER — Ambulatory Visit (HOSPITAL_COMMUNITY)
Admission: EM | Admit: 2019-03-25 | Discharge: 2019-03-25 | Disposition: A | Discharge status: Home / Self Care | Payer: Self-pay

## 2019-03-25 NOTE — ED Notes (Signed)
Patient seen leaving in car.

## 2019-06-12 ENCOUNTER — Other Ambulatory Visit: Payer: Self-pay

## 2019-06-12 ENCOUNTER — Ambulatory Visit (INDEPENDENT_AMBULATORY_CARE_PROVIDER_SITE_OTHER): Payer: Self-pay | Admitting: Infectious Diseases

## 2019-06-12 ENCOUNTER — Encounter: Payer: Self-pay | Admitting: Infectious Diseases

## 2019-06-12 VITALS — BP 129/71 | HR 101 | Temp 98.0°F | Ht 73.0 in | Wt 150.0 lb

## 2019-06-12 DIAGNOSIS — F988 Other specified behavioral and emotional disorders with onset usually occurring in childhood and adolescence: Secondary | ICD-10-CM

## 2019-06-12 DIAGNOSIS — Z23 Encounter for immunization: Secondary | ICD-10-CM

## 2019-06-12 DIAGNOSIS — B20 Human immunodeficiency virus [HIV] disease: Secondary | ICD-10-CM

## 2019-06-12 DIAGNOSIS — Z113 Encounter for screening for infections with a predominantly sexual mode of transmission: Secondary | ICD-10-CM

## 2019-06-12 DIAGNOSIS — A539 Syphilis, unspecified: Secondary | ICD-10-CM

## 2019-06-12 DIAGNOSIS — Z79899 Other long term (current) drug therapy: Secondary | ICD-10-CM

## 2019-06-12 NOTE — Addendum Note (Signed)
Addended by: Valarie Cones on: 06/12/2019 03:10 PM   Modules accepted: Orders

## 2019-06-12 NOTE — Assessment & Plan Note (Signed)
He is doing well Has had dental here. Would like more dental work. Will get him in  Offered/refused condoms.  Check labs today Flu, PCV 13, HPV #2, Me[place] #2 rtc in 9 months.

## 2019-06-12 NOTE — Assessment & Plan Note (Signed)
Appears to be stable, off rx.

## 2019-06-12 NOTE — Progress Notes (Signed)
   Subjective:    Patient ID: Eric Bass, male    DOB: [birth year], 24 y.o.   MRN: [id number]  HPI 23yo M dx HIV 02-2016. He was started on genvoya.  Has been working, taking his medication.  Attributes his wt loss to his work schedule.  "staying steady".  Mom is doing much better.  Was treated for syphilis at last visit.   HIV 1 RNA Quant (copies/mL)  Date Value  12/09/2018 49 (H)  03/07/2018 <20 NOT DETECTED  06/07/2017 <20 NOT DETECTED   CD4 T Cell Abs (/uL)  Date Value  12/09/2018 594  03/07/2018 1,180  06/07/2017 740    Review of Systems  Constitutional: Positive for unexpected weight change. Negative for appetite change.  Respiratory: Negative for shortness of breath and stridor.   Gastrointestinal: Negative for constipation and diarrhea.  Genitourinary: Negative for difficulty urinating.  Please see HPI. All other systems reviewed and negative.     Objective:   Physical Exam Constitutional:      Appearance: Normal appearance. He is normal weight.  HENT:     Mouth/Throat:     Mouth: Mucous membranes are moist.     Pharynx: No oropharyngeal exudate.  Eyes:     Extraocular Movements: Extraocular movements intact.     Pupils: Pupils are equal, round, and reactive to light.  Cardiovascular:     Rate and Rhythm: Normal rate and regular rhythm.  Pulmonary:     Effort: Pulmonary effort is normal.     Breath sounds: Normal breath sounds.  Abdominal:     General: Bowel sounds are normal. There is no distension.     Palpations: Abdomen is soft.     Tenderness: There is no abdominal tenderness.  Musculoskeletal:        General: Normal range of motion.     Cervical back: Normal range of motion and neck supple.     Right lower leg: No edema.     Left lower leg: No edema.  Neurological:     General: No focal deficit present.     Mental Status: He is alert.  Psychiatric:        Mood and Affect: Mood normal.           Assessment & Plan:

## 2019-06-12 NOTE — Assessment & Plan Note (Signed)
Will recheck his RPR today.

## 2019-06-18 LAB — COMPREHENSIVE METABOLIC PANEL
AG Ratio: 1.5 (calc) (ref 1.0–2.5)
ALT: 11 U/L (ref 9–46)
AST: 24 U/L (ref 10–40)
Albumin: 4.2 g/dL (ref 3.6–5.1)
Alkaline phosphatase (APISO): 85 U/L (ref 36–130)
BUN: 10 mg/dL (ref 7–25)
CO2: 27 mmol/L (ref 20–32)
Calcium: 9.6 mg/dL (ref 8.6–10.3)
Chloride: 102 mmol/L (ref 98–110)
Creat: 1.17 mg/dL (ref 0.60–1.35)
Globulin: 2.8 g/dL (calc) (ref 1.9–3.7)
Glucose, Bld: 81 mg/dL (ref 65–99)
Potassium: 4.3 mmol/L (ref 3.5–5.3)
Sodium: 138 mmol/L (ref 135–146)
Total Bilirubin: 0.5 mg/dL (ref 0.2–1.2)
Total Protein: 7 g/dL (ref 6.1–8.1)

## 2019-06-18 LAB — CBC
HCT: 42.3 % (ref 38.5–50.0)
Hemoglobin: 14.9 g/dL (ref 13.2–17.1)
MCH: 34.5 pg — ABNORMAL HIGH (ref 27.0–33.0)
MCHC: 35.2 g/dL (ref 32.0–36.0)
MCV: 97.9 fL (ref 80.0–100.0)
MPV: 11.8 fL (ref 7.5–12.5)
Platelets: 199 10*3/uL (ref 140–400)
RBC: 4.32 10*6/uL (ref 4.20–5.80)
RDW: 12.2 % (ref 11.0–15.0)
WBC: 4.5 10*3/uL (ref 3.8–10.8)

## 2019-06-18 LAB — RPR TITER: RPR Titer: 1:16 {titer} — ABNORMAL HIGH

## 2019-06-18 LAB — HIV-1 RNA ULTRAQUANT REFLEX TO GENTYP+
HIV 1 RNA Quant: <20 copies/mL
HIV-1 RNA Quant, Log: <1.3 Log copies/mL

## 2019-06-18 LAB — FLUORESCENT TREPONEMAL AB(FTA)-IGG-BLD: Fluorescent Treponemal ABS: REACTIVE — AB

## 2019-06-18 LAB — RPR: RPR Ser Ql: REACTIVE — AB

## 2019-06-20 ENCOUNTER — Ambulatory Visit: Payer: Self-pay

## 2019-06-20 ENCOUNTER — Other Ambulatory Visit: Payer: Self-pay

## 2019-10-03 ENCOUNTER — Encounter: Payer: Self-pay | Admitting: Infectious Diseases

## 2019-11-10 ENCOUNTER — Other Ambulatory Visit: Payer: Self-pay

## 2019-11-10 ENCOUNTER — Ambulatory Visit: Payer: Self-pay | Admitting: *Deleted

## 2019-11-10 DIAGNOSIS — B20 Human immunodeficiency virus [HIV] disease: Secondary | ICD-10-CM

## 2019-11-10 NOTE — Progress Notes (Signed)
Patient will get his Covid immunization tomorrow, declines HPV today. He will get this at his office visit. Andree Coss, RN

## 2019-11-14 ENCOUNTER — Other Ambulatory Visit: Payer: Self-pay | Admitting: Infectious Diseases

## 2019-11-14 DIAGNOSIS — B20 Human immunodeficiency virus [HIV] disease: Secondary | ICD-10-CM

## 2019-12-10 ENCOUNTER — Ambulatory Visit: Payer: Self-pay

## 2019-12-22 ENCOUNTER — Ambulatory Visit: Payer: Self-pay

## 2019-12-31 ENCOUNTER — Other Ambulatory Visit: Payer: Self-pay

## 2019-12-31 ENCOUNTER — Ambulatory Visit: Payer: Self-pay

## 2020-01-29 ENCOUNTER — Encounter: Payer: Self-pay | Admitting: Infectious Diseases

## 2020-02-12 ENCOUNTER — Ambulatory Visit: Payer: Self-pay

## 2020-03-01 ENCOUNTER — Other Ambulatory Visit: Payer: Self-pay

## 2020-03-09 ENCOUNTER — Other Ambulatory Visit: Payer: Self-pay

## 2020-03-09 DIAGNOSIS — Z113 Encounter for screening for infections with a predominantly sexual mode of transmission: Secondary | ICD-10-CM

## 2020-03-09 DIAGNOSIS — Z79899 Other long term (current) drug therapy: Secondary | ICD-10-CM

## 2020-03-09 DIAGNOSIS — B20 Human immunodeficiency virus [HIV] disease: Secondary | ICD-10-CM

## 2020-03-10 LAB — T-HELPER CELL (CD4) - (RCID CLINIC ONLY)
CD4 % Helper T Cell: 39 % (ref 33–65)
CD4 T Cell Abs: 971 /uL (ref 400–1790)

## 2020-03-10 LAB — URINE CYTOLOGY ANCILLARY ONLY
Chlamydia: NEGATIVE
Comment: NEGATIVE
Comment: NORMAL
Neisseria Gonorrhea: NEGATIVE

## 2020-03-11 ENCOUNTER — Telehealth: Payer: Self-pay | Admitting: *Deleted

## 2020-03-11 NOTE — Telephone Encounter (Signed)
Attempted to call patient to relay results. Call went to voicemail that is not yet set up on cell phone.  RN called the number listed as home - his mother answered, said give him 5 minutes and call him back on his cell.

## 2020-03-11 NOTE — Telephone Encounter (Signed)
-----   Message from Ginnie Smart, MD sent at 03/11/2020  2:17 PM EDT ----- Pt needs IM Benzathine Pen G 2.4 million units IM x 1

## 2020-03-11 NOTE — Telephone Encounter (Signed)
Patient returned call; confirmed DOB before disclosing information regarding call. Patient was made aware that he did test positive for syphillis and will need treatment. Patient has been treated with Bicillin in the past.  Scheduled for Monday at 2:00 as a nurse visit.  Patient will refrain from sexual activity for at least 10 days after getting injections. Understands he must inform recent partners to get tested/treated at local health department. Will call office with any questions. Lorenso Courier, [place]

## 2020-03-12 LAB — COMPREHENSIVE METABOLIC PANEL
AG Ratio: 1.3 (calc) (ref 1.0–2.5)
ALT: 4 U/L — ABNORMAL LOW (ref 9–46)
AST: 17 U/L (ref 10–40)
Albumin: 4 g/dL (ref 3.6–5.1)
Alkaline phosphatase (APISO): 93 U/L (ref 36–130)
BUN: 9 mg/dL (ref 7–25)
CO2: 28 mmol/L (ref 20–32)
Calcium: 9.2 mg/dL (ref 8.6–10.3)
Chloride: 101 mmol/L (ref 98–110)
Creat: 1.1 mg/dL (ref 0.60–1.35)
Globulin: 3.2 g/dL (calc) (ref 1.9–3.7)
Glucose, Bld: 92 mg/dL (ref 65–99)
Potassium: 4 mmol/L (ref 3.5–5.3)
Sodium: 139 mmol/L (ref 135–146)
Total Bilirubin: 0.6 mg/dL (ref 0.2–1.2)
Total Protein: 7.2 g/dL (ref 6.1–8.1)

## 2020-03-12 LAB — CBC
HCT: 41.4 % (ref 38.5–50.0)
Hemoglobin: 14.1 g/dL (ref 13.2–17.1)
MCH: 32.7 pg (ref 27.0–33.0)
MCHC: 34.1 g/dL (ref 32.0–36.0)
MCV: 96.1 fL (ref 80.0–100.0)
MPV: 12.1 fL (ref 7.5–12.5)
Platelets: 200 10*3/uL (ref 140–400)
RBC: 4.31 10*6/uL (ref 4.20–5.80)
RDW: 12.2 % (ref 11.0–15.0)
WBC: 6.4 10*3/uL (ref 3.8–10.8)

## 2020-03-12 LAB — HIV-1 RNA QUANT-NO REFLEX-BLD
HIV 1 RNA Quant: <20 Copies/mL
HIV-1 RNA Quant, Log: <1.3 Log cps/mL

## 2020-03-12 LAB — LIPID PANEL
Cholesterol: 178 mg/dL (ref <200)
HDL: 60 mg/dL (ref >=40)
LDL Cholesterol (Calc): 93 mg/dL (calc)
Non-HDL Cholesterol (Calc): 118 mg/dL (calc) (ref <130)
Total CHOL/HDL Ratio: 3 (calc) (ref <5.0)
Triglycerides: 148 mg/dL (ref <150)

## 2020-03-12 LAB — RPR: RPR Ser Ql: REACTIVE — AB

## 2020-03-12 LAB — FLUORESCENT TREPONEMAL AB(FTA)-IGG-BLD: Fluorescent Treponemal ABS: REACTIVE — AB

## 2020-03-12 LAB — RPR TITER: RPR Titer: 1:64 {titer} — ABNORMAL HIGH

## 2020-03-15 ENCOUNTER — Other Ambulatory Visit: Payer: Self-pay

## 2020-03-15 ENCOUNTER — Ambulatory Visit (INDEPENDENT_AMBULATORY_CARE_PROVIDER_SITE_OTHER): Payer: Self-pay | Admitting: *Deleted

## 2020-03-15 DIAGNOSIS — A539 Syphilis, unspecified: Secondary | ICD-10-CM

## 2020-03-15 MED ORDER — PENICILLIN G BENZATHINE 1200000 UNIT/2ML IM SUSP
1.2000 10*6.[IU] | Freq: Once | INTRAMUSCULAR | Status: AC
  Administered 2020-03-15: 1.2 10*6.[IU] via INTRAMUSCULAR

## 2020-03-15 NOTE — Progress Notes (Signed)
RN offered/gave condoms, advised patient to remain abstinent for 7-10 days after treatment, and that the health department would be following up with him to confirm treatment. Assisted patient to view his labs via mychart.  Patient's questions answered to their satisfaction.  Andree Coss, RN

## 2020-03-16 ENCOUNTER — Encounter: Payer: Self-pay | Admitting: Infectious Diseases

## 2020-04-02 ENCOUNTER — Encounter: Payer: Self-pay | Admitting: Infectious Diseases

## 2020-04-08 ENCOUNTER — Ambulatory Visit: Payer: Self-pay | Admitting: Infectious Diseases

## 2020-04-14 ENCOUNTER — Telehealth: Payer: Self-pay

## 2020-04-14 NOTE — Telephone Encounter (Signed)
Tiara from [facility] called to verify that patient received Bicillin treatment. RN confirmed 2.4 million units of Bicillin IM were given on 03/15/20.  Sandie Ano, RN

## 2020-06-05 ENCOUNTER — Ambulatory Visit (HOSPITAL_COMMUNITY): Admit: 2020-06-05 | Discharge status: Against Medical Advice | Payer: Self-pay

## 2020-07-26 ENCOUNTER — Ambulatory Visit (INDEPENDENT_AMBULATORY_CARE_PROVIDER_SITE_OTHER): Payer: Self-pay | Admitting: Internal Medicine

## 2020-07-26 ENCOUNTER — Encounter: Payer: Self-pay | Admitting: Internal Medicine

## 2020-07-26 ENCOUNTER — Other Ambulatory Visit: Payer: Self-pay

## 2020-07-26 ENCOUNTER — Telehealth: Payer: Self-pay

## 2020-07-26 ENCOUNTER — Ambulatory Visit: Payer: Self-pay

## 2020-07-26 DIAGNOSIS — Z113 Encounter for screening for infections with a predominantly sexual mode of transmission: Secondary | ICD-10-CM

## 2020-07-26 DIAGNOSIS — B20 Human immunodeficiency virus [HIV] disease: Secondary | ICD-10-CM

## 2020-07-26 DIAGNOSIS — Z23 Encounter for immunization: Secondary | ICD-10-CM

## 2020-07-26 DIAGNOSIS — A539 Syphilis, unspecified: Secondary | ICD-10-CM

## 2020-07-26 MED ORDER — CEFTRIAXONE SODIUM 500 MG IJ SOLR
500.0000 mg | Freq: Once | INTRAMUSCULAR | Status: AC
  Administered 2020-07-26: 500 mg via INTRAMUSCULAR

## 2020-07-26 MED ORDER — AZITHROMYCIN 250 MG PO TABS
1000.0000 mg | ORAL_TABLET | Freq: Once | ORAL | Status: AC
  Administered 2020-07-26: 1000 mg via ORAL

## 2020-07-26 NOTE — Telephone Encounter (Signed)
Patient called front desk and spoke with Jon Gills requesting appointment for STD symptoms. States that two days ago started experiencing penile discharge. Today is worse. Is also having burning sensation while urinating. Front desk will schedule appointment today with Dr. Earlene Plater.

## 2020-07-26 NOTE — Assessment & Plan Note (Signed)
His partner recently was treated for [facility] and pt is having discharge and dysuria.  Will screen urine, rectum, pharynx for GC and CT but also empirically treat with Ceftriaxone 500mg  x 1 IM and azithromycin 1g PO

## 2020-07-26 NOTE — Addendum Note (Signed)
Addended by: Valarie Cones on: 07/26/2020 04:11 PM   Modules accepted: Orders

## 2020-07-26 NOTE — Assessment & Plan Note (Signed)
Patient given Covid mRNA vaccine this afternoon with Pfizer.

## 2020-07-26 NOTE — Assessment & Plan Note (Signed)
His recent RPR titer in Oct 2021 was 1:64 and he received Bicillin x 1.  Will recheck his RPR titer today to ensure adequate response.

## 2020-07-26 NOTE — Assessment & Plan Note (Signed)
He is continuing on Genvoya with out any missed doses. He was undetectable in Oct 2021.  Continue to follow up with Dr Ninetta Lights in about 3 months

## 2020-07-26 NOTE — Progress Notes (Signed)
[facility]  Reason for Consult: Work in for STI concern  Referring Provider: Dr Ninetta Lights   HPI:    Eric Bass is a 25 y.o. male with PMHx as below who presents to the clinic for STI concern.   Patient followed by Dr Ninetta Lights for HIV and currently on Genvoya. Labs in October showed undetectable viral load and CD 4 count of 971.  His RPR titer was 1:64 and he received Bicillin x 1.  Called clinic today for an urgent visit due to penile discharge and dysuria.  He is sexually active with his partner who was recently treated for gonorrhea.  They have engaged in oral and anal intercourse.   Patient's Medications  New Prescriptions   No medications on file  Previous Medications   GENVOYA 150-150-200-10 MG TABS TABLET    TAKE 1 TABLET BY MOUTH EVERY DAY WITH BREAKFAST  Modified Medications   No medications on file  Discontinued Medications   No medications on file      Past Medical History:  Diagnosis Date  . HIV (human immunodeficiency virus infection) (HCC)     Social History   Tobacco Use  . Smoking status: Current Every Day Smoker    Packs/day: 1.00    Types: Cigarettes    Start date: 05/30/1991  . Smokeless tobacco: Never Used  . Tobacco comment: not interested in quitting  Substance Use Topics  . Alcohol use: Yes    Alcohol/week: 4.0 standard drinks    Types: 4 Shots of liquor per week    Comment: socially  . Drug use: Yes    Types: Marijuana    Comment: daily    Family History  Problem Relation Age of Onset  . Hypertension Mother   . Diabetes Maternal Grandmother   . Hypertension Paternal Grandmother   . Tuberculosis Paternal Grandmother   . Diabetes Paternal Grandfather   . Tuberculosis Paternal Grandfather     No Known Allergies  Review of Systems  Constitutional: Negative for chills and fever.  Respiratory: Negative.   Cardiovascular: Negative.   Gastrointestinal: Negative.   Genitourinary: Positive for dysuria.       OBJECTIVE:    There were no vitals filed for this visit.   There is no height or weight on file to calculate BMI.  Physical Exam Constitutional:      General: He is not in acute distress.    Appearance: Normal appearance.  Pulmonary:     Effort: Pulmonary effort is normal. No respiratory distress.  Skin:    General: Skin is warm and dry.     Coloration: Skin is not jaundiced.  Neurological:     General: No focal deficit present.     Mental Status: He is alert and oriented to person, place, and time.  Psychiatric:        Mood and Affect: Mood normal.        Behavior: Behavior normal.      Labs and Microbiology:  CBC Latest Ref Rng & Units 03/09/2020 06/12/2019 12/09/2018  WBC 3.8 - 10.8 Thousand/uL 6.4 4.5 4.2  Hemoglobin 13.2 - 17.1 g/dL 33.2 95.1 88.4  Hematocrit 38.5 - 50.0 % 41.4 42.3 46.3  Platelets 140 - 400 Thousand/uL 200 199 231   CMP Latest Ref Rng & Units 03/09/2020 06/12/2019 12/09/2018  Glucose 65 - 99 mg/dL 92 81 92  BUN 7 - 25 mg/dL 9 10 11   Creatinine 0.60 - 1.35 mg/dL 1.66 0.63  Sodium 135 - 146 mmol/L 139 138 142  Potassium 3.5 - 5.3 mmol/L 4.0 4.3 4.6  Chloride 98 - 110 mmol/L 101 102 109  CO2 20 - 32 mmol/L 28 27 21   Calcium 8.6 - 10.3 mg/dL 9.2 9.6 9.5  Total Protein 6.1 - 8.1 g/dL 7.2 7.0 7.9  Total Bilirubin 0.2 - 1.2 mg/dL 0.6 0.5 0.4  Alkaline Phos 40 - 115 U/L - - -  AST 10 - 40 U/L 17 24 23   ALT 9 - 46 U/L 4(L) 11 4(L)     No results found for this or any previous visit (from the past 240 hour(s)).    ASSESSMENT & PLAN:    HIV disease (HCC) He is continuing on Genvoya with out any missed doses. He was undetectable in Oct 2021.  Continue to follow up with Dr in about 3 months  Syphilis His recent RPR titer in Oct 2021 was 1:64 and he received Bicillin x 1.  Will recheck his RPR titer today to ensure adequate response.  Screening examination for venereal disease His partner recently was treated for [facility] and pt is  having discharge and dysuria.  Will screen urine, rectum, pharynx for GC and CT but also empirically treat with Ceftriaxone 500mg  x 1 IM and azithromycin 1g PO  Encounter for immunization Patient given Covid mRNA vaccine this afternoon with Pfizer.    Orders Placed This Encounter  Procedures  . PFIZER Comirnaty(GRAY TOP)COVID-19 Vaccine  . RPR       Nov 2021 for Infectious Disease [facility] 07/26/2020, 4:00 PM

## 2020-07-26 NOTE — Patient Instructions (Signed)
Thank you for coming to see me today. It was a pleasure seeing you.  To Do: Marland Kitchen Labs and swabs today . We will treat you presumptively for gonorrhea and chlamydia . I'll let you know if any further follow up is needed from today . Follow up with Dr Ninetta Lights or myself in about 3 months.  If you have any questions or concerns, please do not hesitate to call the office at [phone].  Take Care,   Gwynn Burly, DO

## 2020-07-27 ENCOUNTER — Other Ambulatory Visit: Payer: Self-pay

## 2020-07-27 ENCOUNTER — Ambulatory Visit: Payer: Self-pay

## 2020-07-27 DIAGNOSIS — B20 Human immunodeficiency virus [HIV] disease: Secondary | ICD-10-CM

## 2020-07-27 LAB — CYTOLOGY, (ORAL, ANAL, URETHRAL) ANCILLARY ONLY
Chlamydia: NEGATIVE
Chlamydia: NEGATIVE
Comment: NEGATIVE
Comment: NEGATIVE
Comment: NORMAL
Comment: NORMAL
Neisseria Gonorrhea: NEGATIVE
Neisseria Gonorrhea: POSITIVE — AB

## 2020-07-27 LAB — URINE CYTOLOGY ANCILLARY ONLY
Chlamydia: NEGATIVE
Comment: NEGATIVE
Comment: NORMAL
Neisseria Gonorrhea: POSITIVE — AB

## 2020-07-27 MED ORDER — GENVOYA 150-150-200-10 MG PO TABS: ORAL_TABLET | ORAL | 6 refills | Status: DC

## 2020-07-28 LAB — RPR: RPR Ser Ql: REACTIVE — AB

## 2020-07-28 LAB — RPR TITER: RPR Titer: 1:32 {titer} — ABNORMAL HIGH

## 2020-07-28 LAB — FLUORESCENT TREPONEMAL AB(FTA)-IGG-BLD: Fluorescent Treponemal ABS: REACTIVE — AB

## 2020-07-29 ENCOUNTER — Telehealth: Payer: Self-pay

## 2020-07-29 NOTE — Telephone Encounter (Signed)
Attempted to call patient regarding lab results. Not able to reach patient on his mobil number; voicemail is not set up. Alternative number belongs to his mother. Did not disclose any information during that call.  Will forward mychart message regarding results.

## 2020-07-29 NOTE — Telephone Encounter (Signed)
-----   Message from Kathlynn Grate, DO sent at 07/29/2020  9:24 AM EST ----- Please let pt know that his gonorrhea test was positive from urine and rectum.  He was treated during his clinic appt for this. RPR titer has gone down from 1:64 to 1:32 since his previous infection and treatment in October.  In order to be considered an adequate response to treatment there needs to be a 4-fold decrease from 1:64 to 1:16 expected within 6-12 months.  Since he is only about 4 months from treatment, I do not think he needs to be retreated yet but he may need to be if titer does not go down further or if it goes up again.  Thanks, Eric Bass

## 2020-07-30 ENCOUNTER — Encounter: Payer: Self-pay | Admitting: Infectious Diseases

## 2020-10-26 ENCOUNTER — Ambulatory Visit: Payer: Self-pay | Admitting: Infectious Diseases

## 2020-11-09 ENCOUNTER — Other Ambulatory Visit: Payer: Self-pay | Admitting: Infectious Diseases

## 2020-11-09 DIAGNOSIS — B20 Human immunodeficiency virus [HIV] disease: Secondary | ICD-10-CM

## 2020-11-25 ENCOUNTER — Ambulatory Visit (INDEPENDENT_AMBULATORY_CARE_PROVIDER_SITE_OTHER): Payer: Self-pay | Admitting: Infectious Diseases

## 2020-11-25 ENCOUNTER — Other Ambulatory Visit: Payer: Self-pay

## 2020-11-25 VITALS — BP 108/72 | HR 71 | Resp 16 | Ht 73.0 in | Wt 159.0 lb

## 2020-11-25 DIAGNOSIS — A539 Syphilis, unspecified: Secondary | ICD-10-CM

## 2020-11-25 DIAGNOSIS — Z79899 Other long term (current) drug therapy: Secondary | ICD-10-CM

## 2020-11-25 DIAGNOSIS — Z113 Encounter for screening for infections with a predominantly sexual mode of transmission: Secondary | ICD-10-CM

## 2020-11-25 DIAGNOSIS — B20 Human immunodeficiency virus [HIV] disease: Secondary | ICD-10-CM

## 2020-11-25 DIAGNOSIS — A630 Anogenital (venereal) warts: Secondary | ICD-10-CM

## 2020-11-25 NOTE — Assessment & Plan Note (Signed)
Will send him to surgery Explained risk of anal cancer.

## 2020-11-25 NOTE — Assessment & Plan Note (Signed)
He is doing well Check labs today He is given condoms He defers PCV vax.  He needs HPV vax as well Will see him back in 6 months.

## 2020-11-25 NOTE — Assessment & Plan Note (Signed)
Treated 06-2020 Will repeat RPR today.

## 2020-11-25 NOTE — Progress Notes (Signed)
   Subjective:    Patient ID: Eric Bass, male  DOB: 1995/08/04, 25 y.o.        MRN: [id number]   HPI 25 yo M dx HIV 02-2016. He was started on genvoya.   Over last 2 months has noted what he initially thought was a hemorrhoid but now believes may be a wart. He had his relative look who suggested this.  Has been o/w feeling well, exercising, eating well.    HIV 1 RNA Quant  Date Value  03/09/2020 <20 Copies/mL  06/12/2019 <20 NOT DETECTED copies/mL  12/09/2018 49 copies/mL (H)   CD4 T Cell Abs (/uL)  Date Value  03/09/2020 971  12/09/2018 594  03/07/2018 1,180     Health Maintenance  Topic Date Due  . Pneumococcal Vaccine 25-51 Years old (1 - PCV) Never done  . TETANUS/TDAP  01/16/2018  . HPV VACCINES (3 - Risk male 3-dose series) 10/10/2019  . COVID-19 Vaccine (2 - Pfizer risk series) 08/16/2020  . INFLUENZA VACCINE  12/27/2020  . Hepatitis C Screening  Completed  . HIV Screening  Completed     Review of Systems  Constitutional:  Negative for chills, fever and weight loss.  Respiratory:  Negative for cough and sputum production.   Gastrointestinal:  Negative for constipation and diarrhea.  Genitourinary:  Negative for dysuria.  Psychiatric/Behavioral:  The patient does not have insomnia.    Please see HPI. All other systems reviewed and negative.     Objective:  Physical Exam Vitals reviewed.  Constitutional:      Appearance: Normal appearance. He is not ill-appearing or diaphoretic.  HENT:     Mouth/Throat:     Mouth: Mucous membranes are moist.     Pharynx: No oropharyngeal exudate.  Eyes:     Extraocular Movements: Extraocular movements intact.     Pupils: Pupils are equal, round, and reactive to light.  Cardiovascular:     Rate and Rhythm: Normal rate and regular rhythm.  Pulmonary:     Effort: Pulmonary effort is normal.     Breath sounds: Normal breath sounds.  Abdominal:     General: Bowel sounds are normal. There is no distension.      Palpations: Abdomen is soft.     Tenderness: There is no abdominal tenderness.     Comments: He has firm wart at 6 o'clock position.  Non-tender.   Musculoskeletal:     Cervical back: Normal range of motion and neck supple.     Right lower leg: No edema.     Left lower leg: No edema.  Neurological:     General: No focal deficit present.     Mental Status: He is alert.  Psychiatric:        Mood and Affect: Mood normal.          Assessment & Plan:

## 2020-11-26 LAB — T-HELPER CELL (CD4) - (RCID CLINIC ONLY)
CD4 % Helper T Cell: 37 % (ref 33–65)
CD4 T Cell Abs: 852 /uL (ref 400–1790)

## 2020-11-30 LAB — COMPREHENSIVE METABOLIC PANEL
AG Ratio: 1.2 (calc) (ref 1.0–2.5)
ALT: 5 U/L — ABNORMAL LOW (ref 9–46)
AST: 17 U/L (ref 10–40)
Albumin: 3.8 g/dL (ref 3.6–5.1)
Alkaline phosphatase (APISO): 92 U/L (ref 36–130)
BUN: 10 mg/dL (ref 7–25)
CO2: 30 mmol/L (ref 20–32)
Calcium: 9.1 mg/dL (ref 8.6–10.3)
Chloride: 106 mmol/L (ref 98–110)
Creat: 0.97 mg/dL (ref 0.60–1.35)
Globulin: 3.3 g/dL (calc) (ref 1.9–3.7)
Glucose, Bld: 77 mg/dL (ref 65–99)
Potassium: 4.6 mmol/L (ref 3.5–5.3)
Sodium: 140 mmol/L (ref 135–146)
Total Bilirubin: 0.3 mg/dL (ref 0.2–1.2)
Total Protein: 7.1 g/dL (ref 6.1–8.1)

## 2020-11-30 LAB — CBC
HCT: 41 % (ref 38.5–50.0)
Hemoglobin: 13.9 g/dL (ref 13.2–17.1)
MCH: 33.3 pg — ABNORMAL HIGH (ref 27.0–33.0)
MCHC: 33.9 g/dL (ref 32.0–36.0)
MCV: 98.1 fL (ref 80.0–100.0)
MPV: 10.7 fL (ref 7.5–12.5)
Platelets: 266 10*3/uL (ref 140–400)
RBC: 4.18 10*6/uL — ABNORMAL LOW (ref 4.20–5.80)
RDW: 12.2 % (ref 11.0–15.0)
WBC: 5.1 10*3/uL (ref 3.8–10.8)

## 2020-11-30 LAB — FLUORESCENT TREPONEMAL AB(FTA)-IGG-BLD: Fluorescent Treponemal ABS: REACTIVE — AB

## 2020-11-30 LAB — RPR: RPR Ser Ql: REACTIVE — AB

## 2020-11-30 LAB — HIV-1 RNA QUANT-NO REFLEX-BLD
HIV 1 RNA Quant: <20 Copies/mL — ABNORMAL HIGH
HIV-1 RNA Quant, Log: <1.3 Log cps/mL — ABNORMAL HIGH

## 2020-11-30 LAB — RPR TITER: RPR Titer: 1:64 {titer} — ABNORMAL HIGH

## 2020-12-07 ENCOUNTER — Telehealth: Payer: Self-pay | Admitting: Infectious Diseases

## 2020-12-07 ENCOUNTER — Other Ambulatory Visit: Payer: Self-pay | Admitting: Infectious Diseases

## 2020-12-07 DIAGNOSIS — A539 Syphilis, unspecified: Secondary | ICD-10-CM

## 2020-12-07 NOTE — Telephone Encounter (Signed)
Attempted to contact patient as he is without insurance and will need to either pay for the LP up front with [facility] imaging or go to [facility] for procedure.   Unable to leave voicemail as it isn't set up. Forwarding to triage pool for follow up with [facility] scheduling   [facility] scheduling: [phone]  Rosanna Randy, RN

## 2020-12-07 NOTE — Telephone Encounter (Signed)
Called pt- he has repeatedly positive RPR at high titer. He needs LP.

## 2020-12-08 ENCOUNTER — Other Ambulatory Visit: Payer: Self-pay

## 2020-12-08 DIAGNOSIS — A539 Syphilis, unspecified: Secondary | ICD-10-CM

## 2020-12-08 NOTE — Telephone Encounter (Signed)
LP scheduled for 7/20 at 9am. Patient notified of appointment and arrival time (8:30). Patient sent MyChart message with additional information.   Darrol Brandenburg Loyola Mast, RN

## 2020-12-14 ENCOUNTER — Other Ambulatory Visit: Payer: Self-pay | Admitting: Infectious Diseases

## 2020-12-14 DIAGNOSIS — A539 Syphilis, unspecified: Secondary | ICD-10-CM

## 2020-12-15 ENCOUNTER — Other Ambulatory Visit: Payer: Self-pay

## 2020-12-15 ENCOUNTER — Ambulatory Visit (HOSPITAL_COMMUNITY)
Admission: RE | Admit: 2020-12-15 | Discharge: 2020-12-15 | Disposition: A | Discharge status: Home / Self Care | Payer: Self-pay | Source: Ambulatory Visit | Attending: Infectious Diseases | Admitting: Infectious Diseases

## 2020-12-15 DIAGNOSIS — A539 Syphilis, unspecified: Secondary | ICD-10-CM | POA: Insufficient documentation

## 2020-12-15 LAB — CSF CELL COUNT WITH DIFFERENTIAL
RBC Count, CSF: 3 /mm3 — ABNORMAL HIGH
Tube #: 3
WBC, CSF: 6 /mm3 — ABNORMAL HIGH (ref 0–5)

## 2020-12-15 LAB — GLUCOSE, CSF: Glucose, CSF: 61 mg/dL (ref 40–70)

## 2020-12-15 LAB — PROTEIN, CSF: Total  Protein, CSF: 25 mg/dL (ref 15–45)

## 2020-12-15 MED ORDER — LIDOCAINE HCL (PF) 1 % IJ SOLN
5.0000 mL | Freq: Once | INTRAMUSCULAR | Status: AC
  Administered 2020-12-15: 5 mL via INTRADERMAL

## 2020-12-16 LAB — VDRL, CSF: VDRL Quant, CSF: NONREACTIVE

## 2020-12-18 LAB — CSF CULTURE W GRAM STAIN: Culture: NO GROWTH

## 2020-12-20 ENCOUNTER — Telehealth: Payer: Self-pay

## 2020-12-20 NOTE — Telephone Encounter (Signed)
Patient calling regarding lumbar puncture results he saw on MyChart and what next steps for treatment are. He says he has sores in his mouth that hurt 7/10 and wants to start treatment as soon as possible. Will route to provider.   Sandie Ano, RN

## 2020-12-23 LAB — MISC LABCORP TEST (SEND OUT): Labcorp test code: 9985

## 2020-12-30 NOTE — Telephone Encounter (Signed)
Patient is currently in custody at [facility]. RN spoke with Clearance Coots in medical who requested orders for Bicillin be faxed. RN faxed signed orders for bicillin 2.4 million units IM weekly x3 and to continue Genvoya to [facility] medical.   [facility] P: [phone] F: [phone]  Sandie Ano, RN

## 2020-12-31 NOTE — Telephone Encounter (Signed)
Faxed RPR and HIV labs to [facility] per request in order to start Bicillin injections and continue Genvoya.   Sandie Ano, RN

## 2021-03-25 ENCOUNTER — Telehealth: Payer: Self-pay

## 2021-03-25 NOTE — Telephone Encounter (Signed)
[facility] Patient Advocate Encounter  Completed and sent [employer] Advancing Access application for Genvoya for this patient who is uninsured.    Patient is approved 03/25/21 through 05/28/21.        Clearance Coots, CPhT Specialty Pharmacy Patient [facility] for Infectious Disease Phone: [phone] Fax:  [phone]

## 2021-09-19 ENCOUNTER — Ambulatory Visit: Payer: Self-pay

## 2021-09-19 ENCOUNTER — Ambulatory Visit: Payer: Self-pay | Admitting: Family

## 2021-09-20 ENCOUNTER — Other Ambulatory Visit: Payer: Self-pay

## 2021-09-20 ENCOUNTER — Ambulatory Visit (INDEPENDENT_AMBULATORY_CARE_PROVIDER_SITE_OTHER): Payer: Self-pay | Admitting: Family

## 2021-09-20 ENCOUNTER — Encounter: Payer: Self-pay | Admitting: Family

## 2021-09-20 VITALS — BP 118/78 | HR 79 | Temp 98.0°F | Ht 72.0 in | Wt 152.0 lb

## 2021-09-20 DIAGNOSIS — A539 Syphilis, unspecified: Secondary | ICD-10-CM

## 2021-09-20 DIAGNOSIS — Z0000 Encounter for general adult medical examination without abnormal findings: Secondary | ICD-10-CM | POA: Insufficient documentation

## 2021-09-20 DIAGNOSIS — Z79899 Other long term (current) drug therapy: Secondary | ICD-10-CM

## 2021-09-20 DIAGNOSIS — Z113 Encounter for screening for infections with a predominantly sexual mode of transmission: Secondary | ICD-10-CM

## 2021-09-20 DIAGNOSIS — B20 Human immunodeficiency virus [HIV] disease: Secondary | ICD-10-CM

## 2021-09-20 MED ORDER — GENVOYA 150-150-200-10 MG PO TABS: ORAL_TABLET | ORAL | 6 refills | Status: DC

## 2021-09-20 MED ORDER — DOVATO 50-300 MG PO TABS: 1.0000 | ORAL_TABLET | Freq: Every day | ORAL | 0 refills | Status: DC

## 2021-09-20 NOTE — Assessment & Plan Note (Signed)
?   Discussed importance of safe sexual practice and condom use. Condoms offered. ?? Declines vaccines - due for Menveo and Prevnar 20 at next office visit.  ?? Due for routine dental care and will contact [facility] to schedule.  ?

## 2021-09-20 NOTE — Assessment & Plan Note (Addendum)
[name] has well controlled virus with good adherence and tolerance to Genvoya. Reviewed previous lab work and discussed plan of care. Check blood work. Renew financial assistance. Sample of Dovato provided while awaiting UMAP approval ad recorded in pharmacy log. Plan for follow up in 3 months or sooner if needed to renew financial assistance and complete lab work on the same day.  ?

## 2021-09-20 NOTE — Assessment & Plan Note (Signed)
Mr. Pienta was negative for neurosyphilis and treated with 3 weekly injections of Bicillin. No current symptoms and recheck RPR today.  ?

## 2021-09-20 NOTE — Patient Instructions (Addendum)
Nice to see you.  We will check your lab work today.  Continue to take your medication daily as prescribed.  Refills have been sent to the pharmacy.  Plan for follow up in 3 months or sooner if needed with lab work on the same day.  Have a great day and stay safe!  

## 2021-09-20 NOTE — Progress Notes (Signed)
? ? ?Brief Narrative  ? ?Patient ID: Eric Bass, male    DOB: 07/01/1995, 26 y.o.   MRN: [id number] ? ?Eric Bass is a 25 y/o AA male diagnosed with HIV-1 disease in October 2018 with risk factor of heterosexual contact. Initial viral load of 20,560 with CD4 count of 700. Genotype with no significant medication resistant mutations. No history of opportunistic infection. Entered care at [facility] Stage 1.  Sole medication regimen  ? ?Subjective:  ?  ?Chief Complaint  ?Patient presents with  ? Follow-up  ? ? ?HPI: ? ?Eric Bass is a 26 y.o. male with HIV disease last seen by Dr. Johnnye Sima on 11/25/20 with well controlled virus and good adherence and tolerance to his ART regimen of Genvoya. Viral load as undetectable and CD4 count 852. Noted to have persistent elevated RPR previously treated at 1:64 and went down to 1:32 then returned to 1:64. VDRL was negative for neurosyphilis and was treated with 3 weekly injections of Bicillin while incarcerated. Here today for follow up.  ? ?Eric Bass continues to take his Genvoya daily as prescribed with no adverse side effects. Feeling well today with no new concerns complaints. Denies fevers, chills, night sweats, headaches, changes in vision, neck pain/stiffness, nausea, diarrhea, vomiting, lesions or rashes. ? ?Eric Bass's financial assistance expired and was unable to pick up a refill of medication and currently has 1 pill remaining with need to renew UMAP. Denies feelings of being down, depressed or hopeless recently. Continues to smoke marijuana and tobacco with occasional percocet use and occasional alcohol intake. Condoms offered. Healthcare maintenance due includes Prevnar 20, Menveo, and routine dental care. Not currently working and has stable housing and good access to food.  ? ?No Known Allergies ? ? ? ?Outpatient Medications Prior to Visit  ?Medication Sig Dispense Refill  ? elvitegravir-cobicistat-emtricitabine-tenofovir (GENVOYA) 150-150-200-10 MG TABS tablet TAKE 1  TABLET BY MOUTH EVERY DAY WITH BREAKFAST (Patient not taking: Reported on 09/20/2021) 30 tablet 6  ? ?No facility-administered medications prior to visit.  ? ? ? ?Past Medical History:  ?Diagnosis Date  ? HIV (human immunodeficiency virus infection) ([place])   ? ? ? ?History reviewed. No pertinent surgical history. ? ? ? ?Review of Systems  ?Constitutional:  Negative for appetite change, chills, fatigue, fever and unexpected weight change.  ?Eyes:  Negative for visual disturbance.  ?Respiratory:  Negative for cough, chest tightness, shortness of breath and wheezing.   ?Cardiovascular:  Negative for chest pain and leg swelling.  ?Gastrointestinal:  Negative for abdominal pain, constipation, diarrhea, nausea and vomiting.  ?Genitourinary:  Negative for dysuria, flank pain, frequency, genital sores, hematuria and urgency.  ?Skin:  Negative for rash.  ?Allergic/Immunologic: Negative for immunocompromised state.  ?Neurological:  Negative for dizziness and headaches.  ?   ?Objective:  ?  ?BP 118/78   Pulse 79   Temp 98 ?F (36.7 ?C) (Oral)   Ht 6' (1.829 m)   Wt 152 lb (68.9 kg)   SpO2 100%   BMI 20.61 kg/m?  ?Nursing note and vital signs reviewed. ? ?Physical Exam ?Constitutional:   ?   General: He is not in acute distress. ?   Appearance: He is well-developed.  ?Eyes:  ?   Conjunctiva/sclera: Conjunctivae normal.  ?Cardiovascular:  ?   Rate and Rhythm: Normal rate and regular rhythm.  ?   Heart sounds: Normal heart sounds. No murmur heard. ?  No friction rub. No gallop.  ?Pulmonary:  ?   Effort: Pulmonary effort is  normal. No respiratory distress.  ?   Breath sounds: Normal breath sounds. No wheezing or rales.  ?Chest:  ?   Chest wall: No tenderness.  ?Abdominal:  ?   General: Bowel sounds are normal.  ?   Palpations: Abdomen is soft.  ?   Tenderness: There is no abdominal tenderness.  ?Musculoskeletal:  ?   Cervical back: Neck supple.  ?Lymphadenopathy:  ?   Cervical: No cervical adenopathy.  ?Skin: ?   General: Skin  is warm and dry.  ?   Findings: No rash.  ?Neurological:  ?   Mental Status: He is alert and oriented to person, place, and time.  ?Psychiatric:     ?   Behavior: Behavior normal.     ?   Thought Content: Thought content normal.     ?   Judgment: Judgment normal.  ? ? ? ? ?  09/20/2021  ?  2:39 PM 11/25/2020  ?  3:53 PM 07/26/2020  ?  3:40 PM 06/12/2019  ?  2:29 PM 03/18/2018  ? 11:48 AM  ?Depression screen PHQ 2/9  ?Decreased Interest 0 0 0 0 0  ?Down, Depressed, Hopeless 0 0 0 0 0  ?PHQ - 2 Score 0 0 0 0 0  ?  ?   ?Assessment & Plan:  ? ? ?Patient Active Problem List  ? Diagnosis Date Noted  ? Healthcare maintenance 09/20/2021  ? Condyloma 11/25/2020  ? Screening examination for venereal disease 07/26/2020  ? Encounter for immunization 07/26/2020  ? HIV disease ([place]) 03/10/2016  ? Syphilis 03/10/2016  ? Attention deficit disorder 02/17/2008  ? ? ? ?Problem List Items Addressed This Visit   ? ?  ? Other  ? HIV disease ([place]) - Primary  ?  Eric Bass has well controlled virus with good adherence and tolerance to Genvoya. Reviewed previous lab work and discussed plan of care. Check blood work. Renew financial assistance. Sample of Dovato provided while awaiting UMAP approval ad recorded in pharmacy log. Plan for follow up in 3 months or sooner if needed to renew financial assistance and complete lab work on the same day.  ? ?  ?  ? Relevant Medications  ? elvitegravir-cobicistat-emtricitabine-tenofovir (GENVOYA) 150-150-200-10 MG TABS tablet  ? dolutegravir-lamiVUDine (DOVATO) 50-300 MG tablet  ? Other Relevant Orders  ? Comprehensive metabolic panel  ? HIV-1 RNA quant-no reflex-bld  ? Syphilis  ?  Eric Bass was negative for neurosyphilis and treated with 3 weekly injections of Bicillin. No current symptoms and recheck RPR today.  ? ?  ?  ? Relevant Medications  ? elvitegravir-cobicistat-emtricitabine-tenofovir (GENVOYA) 150-150-200-10 MG TABS tablet  ? dolutegravir-lamiVUDine (DOVATO) 50-300 MG tablet  ? Healthcare  maintenance  ?  Discussed importance of safe sexual practice and condom use. Condoms offered. ?Declines vaccines - due for Menveo and Prevnar 20 at next office visit.  ?Due for routine dental care and will contact [facility] to schedule.  ?  ?  ? ?Other Visit Diagnoses   ? ? Screening for STDs (sexually transmitted diseases)      ? Relevant Orders  ? RPR  ? Urine cytology ancillary only  ? Pharmacologic therapy      ? Relevant Orders  ? Lipid panel  ? ?  ? ? ? ?I am having Eulah Pont start on [place]. I am also having him maintain his Genvoya. ? ? ?Meds ordered this encounter  ?Medications  ? elvitegravir-cobicistat-emtricitabine-tenofovir (GENVOYA) 150-150-200-10 MG TABS tablet  ?  Sig: TAKE 1 TABLET  BY MOUTH EVERY DAY WITH BREAKFAST  ?  Dispense:  30 tablet  ?  Refill:  6  ?  Refill once UMAP approved.  ?  Order Specific Question:   Supervising Provider  ?  Answer:   Carlyle Basques [[id number]]  ? dolutegravir-lamiVUDine (DOVATO) 50-300 MG tablet  ?  Sig: Take 1 tablet by mouth daily.  ?  Dispense:  28 tablet  ?  Refill:  0  ?  Order Specific Question:   Supervising Provider  ?  Answer:   Carlyle Basques [[id number]]  ? ? ? ?Follow-up: Return in about 3 months (around 12/20/2021), or if symptoms worsen or fail to improve. ? ? ?Terri Piedra, MSN, FNP-C ?Nurse Practitioner ?[facility] ?[facility] Main number: [phone] ? ? ?

## 2021-09-21 LAB — URINE CYTOLOGY ANCILLARY ONLY
Chlamydia: NEGATIVE
Comment: NEGATIVE
Comment: NORMAL
Neisseria Gonorrhea: NEGATIVE

## 2021-09-23 ENCOUNTER — Other Ambulatory Visit: Payer: Self-pay | Admitting: Pharmacist

## 2021-09-23 DIAGNOSIS — B20 Human immunodeficiency virus [HIV] disease: Secondary | ICD-10-CM

## 2021-09-23 LAB — COMPREHENSIVE METABOLIC PANEL
AG Ratio: 1.4 (calc) (ref 1.0–2.5)
ALT: 5 U/L — ABNORMAL LOW (ref 9–46)
AST: 17 U/L (ref 10–40)
Albumin: 4.1 g/dL (ref 3.6–5.1)
Alkaline phosphatase (APISO): 92 U/L (ref 36–130)
BUN: 9 mg/dL (ref 7–25)
CO2: 32 mmol/L (ref 20–32)
Calcium: 9.4 mg/dL (ref 8.6–10.3)
Chloride: 102 mmol/L (ref 98–110)
Creat: 1.03 mg/dL (ref 0.60–1.24)
Globulin: 3 g/dL (calc) (ref 1.9–3.7)
Glucose, Bld: 79 mg/dL (ref 65–99)
Potassium: 4.2 mmol/L (ref 3.5–5.3)
Sodium: 140 mmol/L (ref 135–146)
Total Bilirubin: 0.6 mg/dL (ref 0.2–1.2)
Total Protein: 7.1 g/dL (ref 6.1–8.1)

## 2021-09-23 LAB — LIPID PANEL
Cholesterol: 185 mg/dL (ref <200)
HDL: 74 mg/dL (ref >=40)
LDL Cholesterol (Calc): 96 mg/dL (calc)
Non-HDL Cholesterol (Calc): 111 mg/dL (calc) (ref <130)
Total CHOL/HDL Ratio: 2.5 (calc) (ref <5.0)
Triglycerides: 60 mg/dL (ref <150)

## 2021-09-23 LAB — FLUORESCENT TREPONEMAL AB(FTA)-IGG-BLD: Fluorescent Treponemal ABS: REACTIVE — AB

## 2021-09-23 LAB — HIV-1 RNA QUANT-NO REFLEX-BLD
HIV 1 RNA Quant: NOT DETECTED copies/mL
HIV-1 RNA Quant, Log: NOT DETECTED Log copies/mL

## 2021-09-23 LAB — RPR: RPR Ser Ql: REACTIVE — AB

## 2021-09-23 LAB — RPR TITER: RPR Titer: 1:64 {titer} — ABNORMAL HIGH

## 2021-09-23 MED ORDER — DOVATO 50-300 MG PO TABS: 1.0000 | ORAL_TABLET | Freq: Every day | ORAL | 0 refills | Status: AC

## 2021-09-23 NOTE — Progress Notes (Signed)
Medication Samples have been provided to the patient. ? ?Drug name: Dovato        ?Strength: 50/300 mg         ?Qty: 28 tablets (2 bottles)   ?LOT: [id number]   ?Exp.Date: 01/2023 ? ?Dosing instructions: Take one tablet by mouth once daily ? ?The patient has been instructed regarding the correct time, dose, and frequency of taking this medication, including desired effects and most common side effects.  ? ?Rhealynn Myhre L. Chrisean Kloth, PharmD, BCIDP, AAHIVP, CPP ?Clinical Pharmacist Practitioner ?Infectious Diseases Clinical Pharmacist ?[facility] ?05/10/2020, 10:07 AM  ?

## 2021-10-19 ENCOUNTER — Encounter: Payer: Self-pay | Admitting: Infectious Diseases

## 2021-12-15 ENCOUNTER — Other Ambulatory Visit: Payer: Self-pay

## 2021-12-15 DIAGNOSIS — Z79899 Other long term (current) drug therapy: Secondary | ICD-10-CM

## 2021-12-15 DIAGNOSIS — Z113 Encounter for screening for infections with a predominantly sexual mode of transmission: Secondary | ICD-10-CM

## 2021-12-15 DIAGNOSIS — B20 Human immunodeficiency virus [HIV] disease: Secondary | ICD-10-CM

## 2021-12-29 ENCOUNTER — Ambulatory Visit: Payer: Self-pay | Admitting: Family

## 2022-03-06 ENCOUNTER — Other Ambulatory Visit (HOSPITAL_COMMUNITY): Payer: Self-pay

## 2022-03-06 ENCOUNTER — Other Ambulatory Visit: Payer: Self-pay

## 2022-03-06 ENCOUNTER — Ambulatory Visit: Payer: Self-pay

## 2022-03-06 DIAGNOSIS — Z113 Encounter for screening for infections with a predominantly sexual mode of transmission: Secondary | ICD-10-CM

## 2022-03-06 DIAGNOSIS — B20 Human immunodeficiency virus [HIV] disease: Secondary | ICD-10-CM

## 2022-03-07 ENCOUNTER — Other Ambulatory Visit: Payer: Self-pay | Admitting: Pharmacist

## 2022-03-07 DIAGNOSIS — B20 Human immunodeficiency virus [HIV] disease: Secondary | ICD-10-CM

## 2022-03-07 LAB — T-HELPER CELL (CD4) - (RCID CLINIC ONLY)
CD4 % Helper T Cell: 37 % (ref 33–65)
CD4 T Cell Abs: 747 /uL (ref 400–1790)

## 2022-03-07 MED ORDER — DOVATO 50-300 MG PO TABS: 1.0000 | ORAL_TABLET | Freq: Every day | ORAL | 0 refills | Status: AC

## 2022-03-07 NOTE — Progress Notes (Signed)
Medication Samples have been provided to the patient.  Drug name: Dovato        Strength: 50/300 mg         Qty: 1 bottle (14 tablets)   LOT: [id number]   Exp.Date: 03/2023  Dosing instructions: Take one tablet by mouth once daily  The patient has been instructed regarding the correct time, dose, and frequency of taking this medication, including desired effects and most common side effects.   Mosi Hannold L. Dereona Kolodny, PharmD, BCIDP, AAHIVP, CPP Clinical Pharmacist Practitioner Infectious Diseases Clinical Pharmacist [facility] 05/10/2020, 10:07 AM  

## 2022-03-08 LAB — COMPLETE METABOLIC PANEL WITH GFR
AG Ratio: 1.2 (calc) (ref 1.0–2.5)
ALT: 6 U/L — ABNORMAL LOW (ref 9–46)
AST: 16 U/L (ref 10–40)
Albumin: 3.8 g/dL (ref 3.6–5.1)
Alkaline phosphatase (APISO): 80 U/L (ref 36–130)
BUN: 11 mg/dL (ref 7–25)
CO2: 28 mmol/L (ref 20–32)
Calcium: 8.9 mg/dL (ref 8.6–10.3)
Chloride: 105 mmol/L (ref 98–110)
Creat: 1.13 mg/dL (ref 0.60–1.24)
Globulin: 3.1 g/dL (calc) (ref 1.9–3.7)
Glucose, Bld: 67 mg/dL (ref 65–99)
Potassium: 4.1 mmol/L (ref 3.5–5.3)
Sodium: 139 mmol/L (ref 135–146)
Total Bilirubin: 0.3 mg/dL (ref 0.2–1.2)
Total Protein: 6.9 g/dL (ref 6.1–8.1)
eGFR: 93 mL/min/{1.73_m2} (ref >=60)

## 2022-03-08 LAB — CBC WITH DIFFERENTIAL/PLATELET
Absolute Monocytes: 239 cells/uL (ref 200–950)
Basophils Absolute: 42 cells/uL (ref 0–200)
Basophils Relative: 1.1 %
Eosinophils Absolute: 110 cells/uL (ref 15–500)
Eosinophils Relative: 2.9 %
HCT: 39.6 % (ref 38.5–50.0)
Hemoglobin: 13.7 g/dL (ref 13.2–17.1)
Lymphs Abs: 2071 cells/uL (ref 850–3900)
MCH: 33.7 pg — ABNORMAL HIGH (ref 27.0–33.0)
MCHC: 34.6 g/dL (ref 32.0–36.0)
MCV: 97.5 fL (ref 80.0–100.0)
MPV: 11 fL (ref 7.5–12.5)
Monocytes Relative: 6.3 %
Neutro Abs: 1338 cells/uL — ABNORMAL LOW (ref 1500–7800)
Neutrophils Relative %: 35.2 %
Platelets: 235 10*3/uL (ref 140–400)
RBC: 4.06 10*6/uL — ABNORMAL LOW (ref 4.20–5.80)
RDW: 12.3 % (ref 11.0–15.0)
Total Lymphocyte: 54.5 %
WBC: 3.8 10*3/uL (ref 3.8–10.8)

## 2022-03-08 LAB — URINE CYTOLOGY ANCILLARY ONLY
Chlamydia: NEGATIVE
Comment: NEGATIVE
Comment: NORMAL
Neisseria Gonorrhea: NEGATIVE

## 2022-03-08 LAB — RPR: RPR Ser Ql: REACTIVE — AB

## 2022-03-08 LAB — RPR TITER: RPR Titer: 1:128 {titer} — ABNORMAL HIGH

## 2022-03-08 LAB — HIV-1 RNA QUANT-NO REFLEX-BLD
HIV 1 RNA Quant: NOT DETECTED Copies/mL
HIV-1 RNA Quant, Log: NOT DETECTED Log cps/mL

## 2022-03-08 LAB — FLUORESCENT TREPONEMAL AB(FTA)-IGG-BLD: Fluorescent Treponemal ABS: REACTIVE — AB

## 2022-03-09 ENCOUNTER — Telehealth: Payer: Self-pay

## 2022-03-09 NOTE — Telephone Encounter (Signed)
-----   Message from [place], [place] sent at 03/09/2022 10:46 AM EDT ----- Please schedule Eric Bass for Bicillin 2.4 million units IM once. Thanks.

## 2022-03-09 NOTE — Telephone Encounter (Signed)
Left voicemail asking patient to return my call.   Destyn Schuyler P Niccolas Loeper, CMA  

## 2022-03-13 ENCOUNTER — Encounter: Payer: Self-pay | Admitting: Family

## 2022-03-22 ENCOUNTER — Ambulatory Visit (INDEPENDENT_AMBULATORY_CARE_PROVIDER_SITE_OTHER): Payer: Self-pay

## 2022-03-22 ENCOUNTER — Other Ambulatory Visit: Payer: Self-pay

## 2022-03-22 DIAGNOSIS — A539 Syphilis, unspecified: Secondary | ICD-10-CM

## 2022-03-22 MED ORDER — PENICILLIN G BENZATHINE 1200000 UNIT/2ML IM SUSY
1.2000 10*6.[IU] | PREFILLED_SYRINGE | Freq: Once | INTRAMUSCULAR | Status: AC
  Administered 2022-03-22: 1.2 10*6.[IU] via INTRAMUSCULAR

## 2022-03-22 NOTE — Progress Notes (Signed)
Verified patient using two identifiers and reviewed and confirmed allergies. Bicillin injections administered per order with chaperone present; patient tolerated well. Educated patient to remain abstinent until treatment completed, plus an additional 10 days. Condoms offered and use encouraged. Advised patient to notify sexual partners for testing and treatment. Questions encouraged and answered. Patient verbalized understanding.   Binnie Kand, RN

## 2022-03-22 NOTE — Telephone Encounter (Signed)
Patient called to schedule treatment appointment for this afternoon 10/25.   Beryle Flock, RN

## 2022-03-27 ENCOUNTER — Telehealth: Payer: Self-pay

## 2022-03-27 ENCOUNTER — Ambulatory Visit: Payer: Self-pay | Admitting: Family

## 2022-03-27 NOTE — Telephone Encounter (Signed)
Called patient to reschedule missed appointment. No answer. Call unable to be completed at mobile number. Left HIPAA-compliant voicemail requesting call back on home phone number. Will also send MyChart message.  Binnie Kand, RN

## 2022-05-03 IMAGING — RF DG SPINAL PUNCT LUMBAR DIAG WITH FL CT GUIDANCE
1 series · 1 of 1 positions shown · non-contrast
Comparison: None

CLINICAL DATA: HIV.  Positive RPR.  Evaluate for neurosyphilis.

EXAM:
DIAGNOSTIC LUMBAR PUNCTURE UNDER FLUOROSCOPIC GUIDANCE

[Series 1: cp_standard · 0.19mm/px · 1 of 1 slices shown]
[im 1/1]
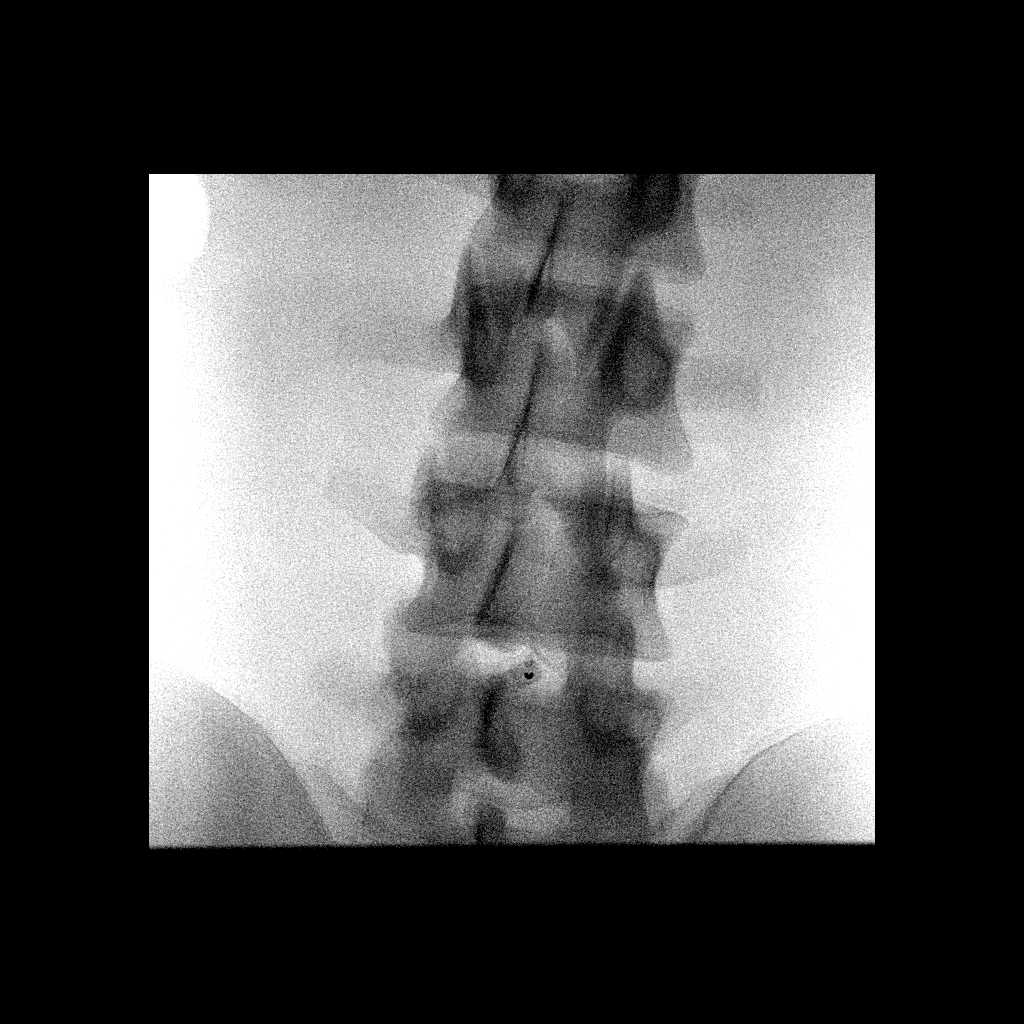

[1 of 1 positions shown; findings below may reference images not displayed]

FLUOROSCOPY TIME:  Fluoroscopy Time:  0 minutes and 30 seconds.

Radiation Exposure Index (if provided by the fluoroscopic device):
2.10

Number of Acquired Spot Images: 0

PROCEDURE:
Informed consent was obtained from the patient prior to the
procedure, including potential complications of headache, allergy,
and pain. An appropriate site for lumbar puncture was marked under
fluoroscopy. With the patient prone, the lower back was prepped with
Betadine. 1% Lidocaine was used for local anesthesia. Lumbar
puncture was performed at the L4-5 level using a 20 gauge needle
with return of clear CSF. 10 ml of CSF were obtained for laboratory
studies. The patient tolerated the procedure well and there were no
apparent complications.
IMPRESSION: Fluoroscopic guided lumbar puncture with 10 cc of CSF obtained for
appropriate laboratory evaluation. Patient tolerated the procedure
well without immediate complications.

## 2022-10-19 ENCOUNTER — Ambulatory Visit: Payer: Self-pay | Admitting: Family

## 2023-01-09 ENCOUNTER — Other Ambulatory Visit: Payer: Self-pay

## 2023-01-09 ENCOUNTER — Ambulatory Visit: Payer: Self-pay

## 2023-01-09 ENCOUNTER — Ambulatory Visit (INDEPENDENT_AMBULATORY_CARE_PROVIDER_SITE_OTHER): Payer: Managed Care, Other (non HMO) | Admitting: Family

## 2023-01-09 ENCOUNTER — Encounter: Payer: Self-pay | Admitting: Family

## 2023-01-09 VITALS — BP 124/79 | HR 94 | Temp 97.3°F | Ht 73.0 in | Wt 153.0 lb

## 2023-01-09 DIAGNOSIS — Z113 Encounter for screening for infections with a predominantly sexual mode of transmission: Secondary | ICD-10-CM

## 2023-01-09 DIAGNOSIS — B20 Human immunodeficiency virus [HIV] disease: Secondary | ICD-10-CM

## 2023-01-09 DIAGNOSIS — Z0000 Encounter for general adult medical examination without abnormal findings: Secondary | ICD-10-CM

## 2023-01-09 DIAGNOSIS — Z79899 Other long term (current) drug therapy: Secondary | ICD-10-CM

## 2023-01-09 DIAGNOSIS — A549 Gonococcal infection, unspecified: Secondary | ICD-10-CM | POA: Diagnosis not present

## 2023-01-09 DIAGNOSIS — A749 Chlamydial infection, unspecified: Secondary | ICD-10-CM

## 2023-01-09 DIAGNOSIS — A539 Syphilis, unspecified: Secondary | ICD-10-CM | POA: Diagnosis not present

## 2023-01-09 MED ORDER — CEFTRIAXONE SODIUM 500 MG IJ SOLR
500.0000 mg | Freq: Once | INTRAMUSCULAR | Status: AC
Start: 2023-01-09 — End: 2023-01-09
  Administered 2023-01-09: 500 mg via INTRAMUSCULAR

## 2023-01-09 MED ORDER — GENVOYA 150-150-200-10 MG PO TABS: 1.0000 | ORAL_TABLET | Freq: Every day | ORAL | 4 refills | Status: DC

## 2023-01-09 MED ORDER — AZITHROMYCIN 250 MG PO TABS
1000.0000 mg | ORAL_TABLET | Freq: Once | ORAL | Status: AC
Start: 2023-01-09 — End: 2023-01-09
  Administered 2023-01-09: 1000 mg via ORAL

## 2023-01-09 NOTE — Assessment & Plan Note (Signed)
Eric Bass has well controlled virus with good adherence and tolerance to Genvoya. True medication adherence is questionable as last refill of medication was noted to be in November of last year. Reviewed previous lab work and discussed plan of care. Check lab work. Continue current dose of Genvoya. Renew financial assistance / Medicaid application. Plan for follow up in 4 months or sooner if needed with lab work on the same day.

## 2023-01-09 NOTE — Assessment & Plan Note (Addendum)
Discussed importance of safe sexual practice and condom use. Condoms and STD testing offered.  Declines vaccines.  Preemptively treat for gonorrhea (500 mg ceftriaxone IM once) and chlamydia (1 g Azithromycin PO once) per request. Additional treatment pending lab work results.

## 2023-01-09 NOTE — Progress Notes (Signed)
Brief Narrative   Patient ID: Eric Bass, male    DOB: 02-04-1996, 27 y.o.   MRN: [id number]  [name] is a 27 y/o AA male diagnosed with HIV-1 disease in October 2018 with risk factor of heterosexual contact. Initial viral load of 20,560 with CD4 count of 700. Genotype with no significant medication resistant mutations. No history of opportunistic infection. Entered care at [facility] Stage 1. Sole medication regimen of Genovya.   Subjective:    Chief Complaint  Patient presents with   Follow-up    HPI:  Eric Bass is a 27 y.o. male with HIV disease last seen on 09/19/2021 with well-controlled virus and good adherence and tolerance to Genvoya.  Viral load was undetectable.  Most recent blood work completed on 03/06/2022 with viral load that remains undetectable and CD4 count of 747.  Kidney function, liver function, electrolytes within normal ranges.  Found to have positive syphilis titer at 1: 128 and treated with Bicillin 2.4 million units on 03/22/22. Here today for follow up.  Eric Bass has been doing okay since his last office visit and continues to take Genvoya with no adverse side effects. Needs to renew financial assistance and requesting STD testing. No current symptoms. Continues to smoke about 2-3 cigarettes per day which is a significant cutting back. Condoms offered. Declines vaccinations.  Denies fevers, chills, night sweats, headaches, changes in vision, neck pain/stiffness, nausea, diarrhea, vomiting, lesions or rashes.    No Known Allergies    Outpatient Medications Prior to Visit  Medication Sig Dispense Refill   elvitegravir-cobicistat-emtricitabine-tenofovir (GENVOYA) 150-150-200-10 MG TABS tablet Take 1 tablet by mouth daily with breakfast.     dolutegravir-lamiVUDine (DOVATO) 50-300 MG tablet Take 1 tablet by mouth daily. (Patient not taking: Reported on 01/09/2023) 28 tablet 0   No facility-administered medications prior to visit.     Past Medical History:   Diagnosis Date   HIV (human immunodeficiency virus infection) (HCC)      No past surgical history on file.    Review of Systems  Constitutional:  Negative for appetite change, chills, fatigue, fever and unexpected weight change.  Eyes:  Negative for visual disturbance.  Respiratory:  Negative for cough, chest tightness, shortness of breath and wheezing.   Cardiovascular:  Negative for chest pain and leg swelling.  Gastrointestinal:  Negative for abdominal pain, constipation, diarrhea, nausea and vomiting.  Genitourinary:  Negative for dysuria, flank pain, frequency, genital sores, hematuria and urgency.  Skin:  Negative for rash.  Allergic/Immunologic: Negative for immunocompromised state.  Neurological:  Negative for dizziness and headaches.      Objective:    BP 124/79   Pulse 94   Temp (!) 97.3 F (36.3 C) (Temporal)   Ht 6\' 1"  (1.854 m)   Wt 153 lb (69.4 kg)   SpO2 100%   BMI 20.19 kg/m  Nursing note and vital signs reviewed.  Physical Exam Constitutional:      General: He is not in acute distress.    Appearance: He is well-developed.  Eyes:     Conjunctiva/sclera: Conjunctivae normal.  Cardiovascular:     Rate and Rhythm: Normal rate and regular rhythm.     Heart sounds: Normal heart sounds. No murmur heard.    No friction rub. No gallop.  Pulmonary:     Effort: Pulmonary effort is normal. No respiratory distress.     Breath sounds: Normal breath sounds. No wheezing or rales.  Chest:     Chest wall: No tenderness.  Abdominal:     General: Bowel sounds are normal.     Palpations: Abdomen is soft.     Tenderness: There is no abdominal tenderness.  Musculoskeletal:     Cervical back: Neck supple.  Lymphadenopathy:     Cervical: No cervical adenopathy.  Skin:    General: Skin is warm and dry.     Findings: No rash.  Neurological:     Mental Status: He is alert and oriented to person, place, and time.  Psychiatric:        Behavior: Behavior normal.         Thought Content: Thought content normal.        Judgment: Judgment normal.         01/09/2023    2:21 PM 09/20/2021    2:39 PM 11/25/2020    3:53 PM 07/26/2020    3:40 PM 06/12/2019    2:29 PM  Depression screen PHQ 2/9  Decreased Interest 0 0 0 0 0  Down, Depressed, Hopeless 0 0 0 0 0  PHQ - 2 Score 0 0 0 0 0       Assessment & Plan:    Patient Active Problem List   Diagnosis Date Noted   Healthcare maintenance 09/20/2021   Condyloma 11/25/2020   Screening examination for venereal disease 07/26/2020   Encounter for immunization 07/26/2020   HIV disease (HCC) 03/10/2016   Syphilis 03/10/2016   Attention deficit disorder 02/17/2008     Problem List Items Addressed This Visit       Other   HIV disease (HCC) - Primary    Eric Bass has well controlled virus with good adherence and tolerance to Genvoya. True medication adherence is questionable as last refill of medication was noted to be in November of last year. Reviewed previous lab work and discussed plan of care. Check lab work. Continue current dose of Genvoya. Renew financial assistance / Medicaid application. Plan for follow up in 4 months or sooner if needed with lab work on the same day.       Relevant Medications   elvitegravir-cobicistat-emtricitabine-tenofovir (GENVOYA) 150-150-200-10 MG TABS tablet   Other Relevant Orders   COMPLETE METABOLIC PANEL WITH GFR   HIV-1 RNA quant-no reflex-bld   T-helper cell (CD4)- ([facility] clinic only)   Syphilis    Previously increased titer of 1:128 and treated with 2.4 million units of Bicillin IM once. No current symptoms concerning for infection. Check RPR.       Relevant Medications   elvitegravir-cobicistat-emtricitabine-tenofovir (GENVOYA) 150-150-200-10 MG TABS tablet   Other Relevant Orders   RPR   Healthcare maintenance    Discussed importance of safe sexual practice and condom use. Condoms and STD testing offered.  Declines vaccines.  Preemptively treat for  gonorrhea (500 mg ceftriaxone IM once) and chlamydia (1 g Azithromycin PO once) per request. Additional treatment pending lab work results.       Other Visit Diagnoses     Screening for STDs (sexually transmitted diseases)       Relevant Orders   Urine cytology ancillary only   Cytology (oral, anal, urethral) ancillary only   Cytology (oral, anal, urethral) ancillary only   Gonorrhea       Relevant Medications   elvitegravir-cobicistat-emtricitabine-tenofovir (GENVOYA) 150-150-200-10 MG TABS tablet   cefTRIAXone (ROCEPHIN) injection 500 mg (Completed)   azithromycin (ZITHROMAX) tablet 1,000 mg (Completed)   Chlamydia       Relevant Medications   elvitegravir-cobicistat-emtricitabine-tenofovir (GENVOYA) 150-150-200-10 MG TABS tablet   cefTRIAXone (ROCEPHIN)  injection 500 mg (Completed)   azithromycin (ZITHROMAX) tablet 1,000 mg (Completed)        I have discontinued Karie Kirks. Eric Bass's Dovato. I am also having him maintain his Genvoya. We administered cefTRIAXone and azithromycin.   Meds ordered this encounter  Medications   elvitegravir-cobicistat-emtricitabine-tenofovir (GENVOYA) 150-150-200-10 MG TABS tablet    Sig: Take 1 tablet by mouth daily with breakfast.    Dispense:  30 tablet    Refill:  4    Order Specific Question:   Supervising Provider    Answer:   Drue Second, CYNTHIA [[id number]]   cefTRIAXone (ROCEPHIN) injection 500 mg   azithromycin (ZITHROMAX) tablet 1,000 mg     Follow-up: Return in about 4 months (around 05/11/2023), or if symptoms worsen or fail to improve.   Marcos Eke, MSN, FNP-C Nurse Practitioner [facility] Main number: [phone]

## 2023-01-09 NOTE — Assessment & Plan Note (Signed)
Previously increased titer of 1:128 and treated with 2.4 million units of Bicillin IM once. No current symptoms concerning for infection. Check RPR.

## 2023-01-09 NOTE — Patient Instructions (Addendum)
Nice to see you.  We will check your lab work today.  Continue to take your medication daily as prescribed.  Refills have been sent to the pharmacy.  Plan for follow up in 4 months or sooner if needed with lab work on the same day.  Have a great day and stay safe!  [facility]  Dr Elder Love  phone [phone] Fax [phone]  Dental [phone]

## 2023-01-09 NOTE — Progress Notes (Signed)
Lab orders replaced and will draw tomorrow. Valarie Cones, LPN

## 2023-01-10 ENCOUNTER — Other Ambulatory Visit: Payer: Self-pay

## 2023-01-10 ENCOUNTER — Other Ambulatory Visit: Payer: Managed Care, Other (non HMO)

## 2023-01-10 DIAGNOSIS — Z113 Encounter for screening for infections with a predominantly sexual mode of transmission: Secondary | ICD-10-CM

## 2023-01-10 DIAGNOSIS — B20 Human immunodeficiency virus [HIV] disease: Secondary | ICD-10-CM

## 2023-01-10 DIAGNOSIS — Z79899 Other long term (current) drug therapy: Secondary | ICD-10-CM

## 2023-01-11 LAB — T-HELPER CELL (CD4) - (RCID CLINIC ONLY)
CD4 % Helper T Cell: 37 % (ref 33–65)
CD4 T Cell Abs: 758 /uL (ref 400–1790)

## 2023-01-13 LAB — COMPLETE METABOLIC PANEL WITHOUT GFR
AG Ratio: 1.5 (calc) (ref 1.0–2.5)
ALT: 7 U/L — ABNORMAL LOW (ref 9–46)
AST: 28 U/L (ref 10–40)
Albumin: 4.5 g/dL (ref 3.6–5.1)
Alkaline phosphatase (APISO): 83 U/L (ref 36–130)
BUN: 9 mg/dL (ref 7–25)
CO2: 30 mmol/L (ref 20–32)
Calcium: 9.7 mg/dL (ref 8.6–10.3)
Chloride: 102 mmol/L (ref 98–110)
Creat: 1.05 mg/dL (ref 0.60–1.24)
Globulin: 3 g/dL (ref 1.9–3.7)
Glucose, Bld: 82 mg/dL (ref 65–99)
Potassium: 4.1 mmol/L (ref 3.5–5.3)
Sodium: 141 mmol/L (ref 135–146)
Total Bilirubin: 0.6 mg/dL (ref 0.2–1.2)
Total Protein: 7.5 g/dL (ref 6.1–8.1)
eGFR: 100 mL/min/1.73m2

## 2023-01-13 LAB — CBC WITH DIFFERENTIAL/PLATELET
Absolute Monocytes: 499 {cells}/uL (ref 200–950)
Basophils Absolute: 39 {cells}/uL (ref 0–200)
Basophils Relative: 0.9 %
Eosinophils Absolute: 120 {cells}/uL (ref 15–500)
Eosinophils Relative: 2.8 %
HCT: 45.4 % (ref 38.5–50.0)
Hemoglobin: 15.2 g/dL (ref 13.2–17.1)
Lymphs Abs: 2301 {cells}/uL (ref 850–3900)
MCH: 32.5 pg (ref 27.0–33.0)
MCHC: 33.5 g/dL (ref 32.0–36.0)
MCV: 97.2 fL (ref 80.0–100.0)
MPV: 11.9 fL (ref 7.5–12.5)
Monocytes Relative: 11.6 %
Neutro Abs: 1342 {cells}/uL — ABNORMAL LOW (ref 1500–7800)
Neutrophils Relative %: 31.2 %
Platelets: 227 10*3/uL (ref 140–400)
RBC: 4.67 10*6/uL (ref 4.20–5.80)
RDW: 12.2 % (ref 11.0–15.0)
Total Lymphocyte: 53.5 %
WBC: 4.3 10*3/uL (ref 3.8–10.8)

## 2023-01-13 LAB — LIPID PANEL
Cholesterol: 185 mg/dL
HDL: 72 mg/dL
LDL Cholesterol (Calc): 94 mg/dL
Non-HDL Cholesterol (Calc): 113 mg/dL
Total CHOL/HDL Ratio: 2.6 (calc)
Triglycerides: 101 mg/dL

## 2023-01-13 LAB — HIV-1 RNA QUANT-NO REFLEX-BLD
HIV 1 RNA Quant: NOT DETECTED {copies}/mL
HIV-1 RNA Quant, Log: NOT DETECTED {Log_copies}/mL

## 2023-01-13 LAB — SYPHILIS: RPR W/REFLEX TO RPR TITER AND TREPONEMAL ANTIBODIES, TRADITIONAL SCREENING AND DIAGNOSIS ALGORITHM: RPR Ser Ql: REACTIVE — AB

## 2023-01-13 LAB — RPR TITER: RPR Titer: 1:64 {titer} — ABNORMAL HIGH

## 2023-01-13 LAB — T PALLIDUM AB: T Pallidum Abs: POSITIVE — AB

## 2023-01-16 ENCOUNTER — Telehealth: Payer: Self-pay

## 2023-01-16 NOTE — Telephone Encounter (Signed)
Patient advised of lab results and scheduled for and appointment on 01/17/23 for treatment.  Patient verbalized understanding.  Takyia Sindt Jonathon Resides, CMA

## 2023-01-16 NOTE — Telephone Encounter (Signed)
-----   Message from Jeanine Luz sent at 01/16/2023 12:59 PM EDT ----- Please schedule Eric Bass for 2.4 million units of Bicillin IM once. Thanks.

## 2023-01-17 ENCOUNTER — Ambulatory Visit: Payer: Medicaid Other

## 2023-01-18 ENCOUNTER — Other Ambulatory Visit: Payer: Self-pay

## 2023-01-18 ENCOUNTER — Ambulatory Visit (INDEPENDENT_AMBULATORY_CARE_PROVIDER_SITE_OTHER): Payer: Managed Care, Other (non HMO)

## 2023-01-18 DIAGNOSIS — A539 Syphilis, unspecified: Secondary | ICD-10-CM

## 2023-01-18 MED ORDER — PENICILLIN G BENZATHINE 1200000 UNIT/2ML IM SUSY
2.4000 10*6.[IU] | PREFILLED_SYRINGE | Freq: Once | INTRAMUSCULAR | Status: AC
Start: 2023-01-18 — End: 2023-01-18
  Administered 2023-01-18: 2.4 10*6.[IU] via INTRAMUSCULAR

## 2023-05-28 ENCOUNTER — Ambulatory Visit: Payer: Commercial Managed Care - HMO | Admitting: Family

## 2023-06-25 ENCOUNTER — Ambulatory Visit: Payer: Commercial Managed Care - HMO | Admitting: Family

## 2023-06-25 ENCOUNTER — Other Ambulatory Visit: Payer: Self-pay

## 2023-06-25 ENCOUNTER — Emergency Department (HOSPITAL_COMMUNITY)
Admission: EM | Admit: 2023-06-25 | Discharge: 2023-06-25 | Disposition: A | Discharge status: Home / Self Care | Payer: Commercial Managed Care - HMO | Attending: Emergency Medicine | Admitting: Emergency Medicine

## 2023-06-25 ENCOUNTER — Emergency Department (HOSPITAL_COMMUNITY): Payer: Commercial Managed Care - HMO

## 2023-06-25 ENCOUNTER — Encounter (HOSPITAL_COMMUNITY): Payer: Self-pay | Admitting: Emergency Medicine

## 2023-06-25 DIAGNOSIS — Z21 Asymptomatic human immunodeficiency virus [HIV] infection status: Secondary | ICD-10-CM | POA: Insufficient documentation

## 2023-06-25 DIAGNOSIS — R509 Fever, unspecified: Secondary | ICD-10-CM | POA: Diagnosis present

## 2023-06-25 DIAGNOSIS — J101 Influenza due to other identified influenza virus with other respiratory manifestations: Secondary | ICD-10-CM | POA: Insufficient documentation

## 2023-06-25 DIAGNOSIS — Z20822 Contact with and (suspected) exposure to covid-19: Secondary | ICD-10-CM | POA: Diagnosis not present

## 2023-06-25 LAB — COMPREHENSIVE METABOLIC PANEL
ALT: 10 U/L (ref 0–44)
AST: 23 U/L (ref 15–41)
Albumin: 3.5 g/dL (ref 3.5–5.0)
Alkaline Phosphatase: 82 U/L (ref 38–126)
Anion gap: 11 (ref 5–15)
BUN: 6 mg/dL (ref 6–20)
CO2: 22 mmol/L (ref 22–32)
Calcium: 8.7 mg/dL — ABNORMAL LOW (ref 8.9–10.3)
Chloride: 104 mmol/L (ref 98–111)
Creatinine, Ser: 1.33 mg/dL — ABNORMAL HIGH (ref 0.61–1.24)
GFR, Estimated: >60 mL/min (ref >60)
Glucose, Bld: 103 mg/dL — ABNORMAL HIGH (ref 70–99)
Potassium: 3.6 mmol/L (ref 3.5–5.1)
Sodium: 137 mmol/L (ref 135–145)
Total Bilirubin: 0.6 mg/dL (ref 0.0–1.2)
Total Protein: 7 g/dL (ref 6.5–8.1)

## 2023-06-25 LAB — CBC
HCT: 41.4 % (ref 39.0–52.0)
Hemoglobin: 14.2 g/dL (ref 13.0–17.0)
MCH: 32.9 pg (ref 26.0–34.0)
MCHC: 34.3 g/dL (ref 30.0–36.0)
MCV: 96.1 fL (ref 80.0–100.0)
Platelets: 149 10*3/uL — ABNORMAL LOW (ref 150–400)
RBC: 4.31 MIL/uL (ref 4.22–5.81)
RDW: 12.2 % (ref 11.5–15.5)
WBC: 5.6 10*3/uL (ref 4.0–10.5)
nRBC: 0 % (ref 0.0–0.2)

## 2023-06-25 LAB — RESP PANEL BY RT-PCR (RSV, FLU A&B, COVID)  RVPGX2
Influenza A by PCR: POSITIVE — AB
Influenza B by PCR: NEGATIVE
Resp Syncytial Virus by PCR: NEGATIVE
SARS Coronavirus 2 by RT PCR: NEGATIVE

## 2023-06-25 MED ORDER — BENZONATATE 100 MG PO CAPS: 100.0000 mg | ORAL_CAPSULE | Freq: Three times a day (TID) | ORAL | 0 refills | Status: DC

## 2023-06-25 MED ORDER — OSELTAMIVIR PHOSPHATE 75 MG PO CAPS: 75.0000 mg | ORAL_CAPSULE | Freq: Two times a day (BID) | ORAL | 0 refills | Status: DC

## 2023-06-25 MED ORDER — ACETAMINOPHEN 325 MG PO TABS
650.0000 mg | ORAL_TABLET | Freq: Once | ORAL | Status: AC | PRN
  Administered 2023-06-25: 650 mg via ORAL
  Filled 2023-06-25: qty 2

## 2023-06-25 NOTE — Discharge Instructions (Addendum)
Seen in emergency room today and found to have the flu.  Make sure you are staying well-hydrated primarily water you can alternate Pedialyte and Gatorade as needed.  Please alternate Tylenol and ibuprofen.  You can take Tylenol 1000 mg up to 4 times a day.  I have also sent Tamiflu.  He does have side effect of GI upset nausea and vomiting.  I have sent Tessalon to take as needed for cough.  Return to emergency room if you have any new or worsening symptoms.

## 2023-06-25 NOTE — ED Provider Notes (Signed)
EMERGENCY DEPARTMENT AT [facility] Provider Note   CSN: [id number] Arrival date & time: 06/25/23  4098     History  Chief Complaint  Patient presents with   Fever    Eric Bass is a 28 y.o. male patient with past medical history of HIV on antivirals with nondetectable load presenting to emergency room with 2 days of fever, cough, headache, body aches.  Patient was recently around a family member who has both flu and COVID.  He has not taken anything for symptoms at home.  Denies any abdominal pain, chest pain, shortness of breath.   Fever      Home Medications Prior to Admission medications   Medication Sig Start Date End Date Taking? Authorizing Provider  benzonatate (TESSALON) 100 MG capsule Take 1 capsule (100 mg total) by mouth every 8 (eight) hours. 06/25/23  Yes Eric Bass, Horald Chestnut, PA-C  oseltamivir (TAMIFLU) 75 MG capsule Take 1 capsule (75 mg total) by mouth every 12 (twelve) hours. 06/25/23  Yes Eric Bass, Horald Chestnut, PA-C  elvitegravir-cobicistat-emtricitabine-tenofovir (GENVOYA) 150-150-200-10 MG TABS tablet Take 1 tablet by mouth daily with breakfast. 01/09/23   Eric Speak, FNP      Allergies    Patient has no known allergies.    Review of Systems   Review of Systems  Constitutional:  Positive for fever.    Physical Exam Updated Vital Signs BP 125/70   Pulse (!) 0   Temp (!) 100.5 F (38.1 C) (Oral)   Resp 18   Wt 72.6 kg   SpO2 100%   BMI 21.11 kg/m  Physical Exam Vitals and nursing note reviewed.  Constitutional:      General: He is not in acute distress.    Appearance: He is not toxic-appearing.  HENT:     Head: Normocephalic and atraumatic.  Eyes:     General: No scleral icterus.    Conjunctiva/sclera: Conjunctivae normal.  Cardiovascular:     Rate and Rhythm: Normal rate and regular rhythm.     Pulses: Normal pulses.     Heart sounds: Normal heart sounds.  Pulmonary:     Effort: Pulmonary effort is normal. No  respiratory distress.     Breath sounds: Normal breath sounds.  Abdominal:     General: Abdomen is flat. Bowel sounds are normal.     Palpations: Abdomen is soft.     Tenderness: There is no abdominal tenderness.  Musculoskeletal:     Right lower leg: No edema.     Left lower leg: No edema.  Skin:    General: Skin is warm and dry.     Findings: No lesion.  Neurological:     General: No focal deficit present.     Mental Status: He is alert and oriented to person, place, and time. Mental status is at baseline.     ED Results / Procedures / Treatments   Labs (all labs ordered are listed, but only abnormal results are displayed) Labs Reviewed  RESP PANEL BY RT-PCR (RSV, FLU A&B, COVID)  RVPGX2 - Abnormal; Notable for the following components:      Result Value   Influenza A by PCR POSITIVE (*)    All other components within normal limits  CBC - Abnormal; Notable for the following components:   Platelets 149 (*)    All other components within normal limits  COMPREHENSIVE METABOLIC PANEL - Abnormal; Notable for the following components:   Glucose, Bld 103 (*)    Creatinine,  Ser 1.33 (*)    Calcium 8.7 (*)    All other components within normal limits    EKG None  Radiology DG Chest 2 View Result Date: 06/25/2023 CLINICAL DATA:  Cough. EXAM: CHEST - 2 VIEW COMPARISON:  None Available. FINDINGS: The heart size and mediastinal contours are within normal limits. Both lungs are clear. The visualized skeletal structures are unremarkable. IMPRESSION: No active cardiopulmonary disease. Electronically Signed   By: Elgie Collard M.D.   On: 06/25/2023 10:29    Procedures Procedures    Medications Ordered in ED Medications  acetaminophen (TYLENOL) tablet 650 mg (650 mg Oral Given 06/25/23 1610)    ED Course/ Medical Decision Making/ A&P                                 Medical Decision Making Amount and/or Complexity of Data Reviewed Labs: ordered. Radiology:  ordered.  Risk OTC drugs. Prescription drug management.   Eric Bass 28 y.o. presented today for URI like symptoms. Working DDx that I considered at this time includes, but not limited to, viral illness, pharyngitis, mono, sinusitis, electrolyte abnormality, AOM.  R/o DDx: these additional diagnoses are not consistent with patient's history, presentation, physical exam, labs/imaging findings.  Review of prior external notes: none  Labs:  Respiratory Panel: Today for influenza A CBC without leukocytosis, no anemia.  CMP without significant electrolyte abnormality no elevation in AST or ALT and normal GFR.   Imaging:  Chest x-ray without acute abnormality  Problem List / ED Course / Critical interventions / Medication management  Patient presenting with flulike symptoms.  He is positive for flu A.  Labs are reassuring with no leukocytosis.  No electrolyte abnormality.  Chest x-ray without acute abnormality.  Patient hemodynamically stable throughout stay speaking in full sentences no shortness of breath or sign of distress.  Given reassuring physical exam and vitals feel patient is stable for discharge.  Discussed over-the-counter management will prescribe Tamiflu.  Discussed side effects of Tamiflu.  Patient agrees to plan and understands treatment. I ordered medication including tylenol for fever  Reevaluation of the patient after these medicines showed that the patient improved Patients vitals assessed. Upon arrival patient is hemodynamically stable.  I have reviewed the patients home medicines and have made adjustments as needed            Final Clinical Impression(s) / ED Diagnoses Final diagnoses:  Influenza A    Rx / DC Orders ED Discharge Orders          Ordered    benzonatate (TESSALON) 100 MG capsule  Every 8 hours        06/25/23 1204    oseltamivir (TAMIFLU) 75 MG capsule  Every 12 hours        06/25/23 1204              Eric Bass, Horald Chestnut,  PA-C 06/25/23 1251    Eric Spikes, DO 06/25/23 1535

## 2023-06-25 NOTE — ED Provider Triage Note (Signed)
Emergency Medicine Provider Triage Evaluation Note  Eric Bass , a 28 y.o. male  was evaluated in triage.  Pt complains of still aches, cough, fever.  Reports recent sick exposure to COVID and flu.  Does have history of HIV.  Is a current smoker.  Review of Systems  Positive: cough Negative: NVD  Physical Exam  BP 113/74 (BP Location: Right Arm)   Pulse (!) 102   Temp (!) 101.7 F (38.7 C)   Resp 16   Wt 72.6 kg   SpO2 98%   BMI 21.11 kg/m  Gen:   Awake, no distress   Resp:  Normal effort  MSK:   Moves extremities without difficulty  Other:  Bilateral rhonchi   Medical Decision Making  Medically screening exam initiated at 9:40 AM.  Appropriate orders placed.  LONN IM was informed that the remainder of the evaluation will be completed by another provider, this initial triage assessment does not replace that evaluation, and the importance of remaining in the ED until their evaluation is complete.     Smitty Knudsen, [place] 06/25/23 2440

## 2023-06-25 NOTE — ED Triage Notes (Addendum)
Pt presents for fever, cough, HA, body aches. Was recently around a family member with covid.  H/o HIV  No tylenol or motrin taken at home

## 2023-07-03 ENCOUNTER — Ambulatory Visit: Payer: Commercial Managed Care - HMO | Admitting: Internal Medicine

## 2023-07-24 ENCOUNTER — Ambulatory Visit (INDEPENDENT_AMBULATORY_CARE_PROVIDER_SITE_OTHER): Payer: Commercial Managed Care - HMO | Admitting: Pharmacist

## 2023-07-24 ENCOUNTER — Other Ambulatory Visit: Payer: Self-pay

## 2023-07-24 ENCOUNTER — Other Ambulatory Visit (HOSPITAL_COMMUNITY)
Admission: RE | Admit: 2023-07-24 | Discharge: 2023-07-24 | Disposition: A | Discharge status: Home / Self Care | Payer: Commercial Managed Care - HMO | Source: Ambulatory Visit | Attending: Family | Admitting: Family

## 2023-07-24 DIAGNOSIS — Z113 Encounter for screening for infections with a predominantly sexual mode of transmission: Secondary | ICD-10-CM
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
## 2023-07-25 LAB — URINE CYTOLOGY ANCILLARY ONLY
Chlamydia: NEGATIVE
Comment: NEGATIVE
Comment: NORMAL
Neisseria Gonorrhea: NEGATIVE

## 2023-07-25 LAB — CYTOLOGY, (ORAL, ANAL, URETHRAL) ANCILLARY ONLY
Chlamydia: NEGATIVE
Chlamydia: NEGATIVE
Comment: NEGATIVE
Comment: NEGATIVE
Comment: NORMAL
Comment: NORMAL
Neisseria Gonorrhea: NEGATIVE
Neisseria Gonorrhea: NEGATIVE

## 2023-07-27 LAB — RPR TITER: RPR Titer: 1:32 {titer} — ABNORMAL HIGH

## 2023-07-27 LAB — T PALLIDUM AB: T Pallidum Abs: POSITIVE — AB

## 2023-07-27 LAB — RPR: RPR Ser Ql: REACTIVE — AB

## 2023-08-15 ENCOUNTER — Ambulatory Visit (INDEPENDENT_AMBULATORY_CARE_PROVIDER_SITE_OTHER): Payer: Commercial Managed Care - HMO | Admitting: Family

## 2023-08-15 ENCOUNTER — Encounter: Payer: Self-pay | Admitting: Family

## 2023-08-15 ENCOUNTER — Other Ambulatory Visit: Payer: Self-pay

## 2023-08-15 VITALS — BP 127/76 | HR 64 | Temp 97.7°F | Ht 72.0 in | Wt 160.0 lb

## 2023-08-15 DIAGNOSIS — F17210 Nicotine dependence, cigarettes, uncomplicated: Secondary | ICD-10-CM | POA: Diagnosis not present

## 2023-08-15 DIAGNOSIS — A539 Syphilis, unspecified: Secondary | ICD-10-CM

## 2023-08-15 DIAGNOSIS — B20 Human immunodeficiency virus [HIV] disease: Secondary | ICD-10-CM | POA: Diagnosis not present

## 2023-08-15 DIAGNOSIS — Z720 Tobacco use: Secondary | ICD-10-CM | POA: Insufficient documentation

## 2023-08-15 DIAGNOSIS — Z0000 Encounter for general adult medical examination without abnormal findings: Secondary | ICD-10-CM

## 2023-08-15 MED ORDER — GENVOYA 150-150-200-10 MG PO TABS: 1.0000 | ORAL_TABLET | Freq: Every day | ORAL | 5 refills | Status: DC

## 2023-08-15 NOTE — Assessment & Plan Note (Signed)
 Discussed importance of safe sexual practice and condom use. Condoms and site specific STD testing offered.  Vaccinations reviewed - due for tetanus, meningococcal, pneumococcal, and influenza. Declined following counseling. Due for dental care and will refer to [facility] dental clinic.

## 2023-08-15 NOTE — Assessment & Plan Note (Signed)
 Eric Bass continues to have well-controlled virus with good adherence and tolerance to Genvoya.  Reviewed previous lab work and discussed plan of care and U equals U.  Family-planning discussed.  Check blood work.  Continue current dose of Genvoya.  Social determinants of health reviewed with no interventions indicated.  Plan for follow-up in 6 months or sooner if needed.

## 2023-08-15 NOTE — Assessment & Plan Note (Signed)
 Most recent syphilis titer of 1: 32 down from 1: 128.  No symptoms and was recently checked.  Discussed nature of RPR testing.  Continue to monitor.

## 2023-08-15 NOTE — Assessment & Plan Note (Signed)
 Mr. Mash continues to use tobacco daily and has cut down to approximately 2 cigarettes/day working on quitting.  Discussed importance of tobacco cessation and methods of quitting as well as resources.  Encouraged to continue working on tobacco cessation to reduce risk of complications or development of disease in the future.

## 2023-08-15 NOTE — Progress Notes (Signed)
 Brief Narrative   Patient ID: Eric Bass, male    DOB: 1995-08-13, 28 y.o.   MRN: [id number]  Eric Bass is a 28 y/o AA male diagnosed with HIV-1 disease in October 2018 with risk factor of heterosexual contact. Initial viral load of 20,560 with CD4 count of 700. Genotype with no significant medication resistant mutations. No history of opportunistic infection. Entered care at [facility] Stage 1. Sole medication regimen of Genovya.   Subjective:    Chief Complaint  Patient presents with   Follow-up    B20    HPI:  Eric Bass is a 28 y.o. male with HIV disease last seen on 01/09/23 with well controlled virus and good adherence and tolerance to Genvoya. Viral load was undetectable and CD4 count 758.  Kidney function, liver function, electrolytes within normal ranges.  Lipid profile with triglycerides 101, LDL 94, and HDL 72.  Here today for routine follow-up.  Eric Bass has been doing well since his last office visit and continues to take [facility] as prescribed with no adverse side effects or problems obtaining medication from the pharmacy.  Covered by [employer]uerbach.  Not currently working.  Housing and access to food are stable and he was recently approved for SNAP.  Transportation via [employer]. [place]. Condoms and site specific STD testing offered. Healthcare maintenance reviewed. Due for dental care.   Denies fevers, chills, night sweats, headaches, changes in vision, neck pain/stiffness, nausea, diarrhea, vomiting, lesions or rashes.  Lab Results  Component Value Date   CD4TCELL 37 01/10/2023   CD4TABS 758 01/10/2023   Lab Results  Component Value Date   HIV1RNAQUANT Not Detected 01/10/2023     No Known Allergies    Outpatient Medications Prior to Visit  Medication Sig Dispense Refill   elvitegravir-cobicistat-emtricitabine-tenofovir (GENVOYA) 150-150-200-10 MG TABS tablet Take 1 tablet by mouth daily with breakfast. 30  tablet 4   benzonatate (TESSALON) 100 MG capsule Take 1 capsule (100 mg total) by mouth every 8 (eight) hours. (Patient not taking: Reported on 08/15/2023) 21 capsule 0   oseltamivir (TAMIFLU) 75 MG capsule Take 1 capsule (75 mg total) by mouth every 12 (twelve) hours. (Patient not taking: Reported on 08/15/2023) 10 capsule 0   No facility-administered medications prior to visit.     Past Medical History:  Diagnosis Date   HIV (human immunodeficiency virus infection) (HCC)      History reviewed. No pertinent surgical history.    Review of Systems  Constitutional:  Negative for appetite change, chills, fatigue, fever and unexpected weight change.  Eyes:  Negative for visual disturbance.  Respiratory:  Negative for cough, chest tightness, shortness of breath and wheezing.   Cardiovascular:  Negative for chest pain and leg swelling.  Gastrointestinal:  Negative for abdominal pain, constipation, diarrhea, nausea and vomiting.  Genitourinary:  Negative for dysuria, flank pain, frequency, genital sores, hematuria and urgency.  Skin:  Negative for rash.  Allergic/Immunologic: Negative for immunocompromised state.  Neurological:  Negative for dizziness and headaches.      Objective:    BP 127/76   Pulse 64   Temp 97.7 F (36.5 C) (Temporal)   Ht 6' (1.829 m)   Wt 160 lb (72.6 kg)   BMI 21.70 kg/m  Nursing note and vital signs reviewed.  Physical Exam Constitutional:      General: He is not in acute distress.    Appearance: He is well-developed.  Eyes:  Conjunctiva/sclera: Conjunctivae normal.  Cardiovascular:     Rate and Rhythm: Normal rate and regular rhythm.     Heart sounds: Normal heart sounds. No murmur heard.    No friction rub. No gallop.  Pulmonary:     Effort: Pulmonary effort is normal. No respiratory distress.     Breath sounds: Normal breath sounds. No wheezing or rales.  Chest:     Chest wall: No tenderness.  Abdominal:     General: Bowel sounds are  normal.     Palpations: Abdomen is soft.     Tenderness: There is no abdominal tenderness.  Musculoskeletal:     Cervical back: Neck supple.  Lymphadenopathy:     Cervical: No cervical adenopathy.  Skin:    General: Skin is warm and dry.     Findings: No rash.  Neurological:     Mental Status: He is alert and oriented to person, place, and time.  Psychiatric:        Behavior: Behavior normal.        Thought Content: Thought content normal.        Judgment: Judgment normal.         01/09/2023    2:21 PM 09/20/2021    2:39 PM 11/25/2020    3:53 PM 07/26/2020    3:40 PM 06/12/2019    2:29 PM  Depression screen PHQ 2/9  Decreased Interest 0 0 0 0 0  Down, Depressed, Hopeless 0 0 0 0 0  PHQ - 2 Score 0 0 0 0 0       Assessment & Plan:    Patient Active Problem List   Diagnosis Date Noted   Tobacco use 08/15/2023   Healthcare maintenance 09/20/2021   Condyloma 11/25/2020   Screening examination for venereal disease 07/26/2020   Encounter for immunization 07/26/2020   HIV disease (HCC) 03/10/2016   Syphilis 03/10/2016   Attention deficit disorder 02/17/2008     Problem List Items Addressed This Visit       Other   HIV disease (HCC) - Primary   Eric Bass continues to have well-controlled virus with good adherence and tolerance to Genvoya.  Reviewed previous lab work and discussed plan of care and U equals U.  Family-planning discussed.  Check blood work.  Continue current dose of Genvoya.  Social determinants of health reviewed with no interventions indicated.  Plan for follow-up in 6 months or sooner if needed.      Relevant Medications   elvitegravir-cobicistat-emtricitabine-tenofovir (GENVOYA) 150-150-200-10 MG TABS tablet   Other Relevant Orders   COMPLETE METABOLIC PANEL WITH GFR   HIV-1 RNA quant-no reflex-bld   T-helper cell (CD4)- ([facility] clinic only)   Syphilis   Most recent syphilis titer of 1: 32 down from 1: 128.  No symptoms and was recently checked.   Discussed nature of RPR testing.  Continue to monitor.      Relevant Medications   elvitegravir-cobicistat-emtricitabine-tenofovir (GENVOYA) 150-150-200-10 MG TABS tablet   Healthcare maintenance   Discussed importance of safe sexual practice and condom use. Condoms and site specific STD testing offered.  Vaccinations reviewed - due for tetanus, meningococcal, pneumococcal, and influenza. Declined following counseling. Due for dental care and will refer to [facility].       Tobacco use   Eric Bass continues to use tobacco daily and has cut down to approximately 2 cigarettes/day working on quitting.  Discussed importance of tobacco cessation and methods of quitting as well as resources.  Encouraged to continue  working on tobacco cessation to reduce risk of complications or development of disease in the future.        I have discontinued Karie Kirks. Janice's benzonatate and oseltamivir. I am also having him maintain his Genvoya.   Meds ordered this encounter  Medications   elvitegravir-cobicistat-emtricitabine-tenofovir (GENVOYA) 150-150-200-10 MG TABS tablet    Sig: Take 1 tablet by mouth daily with breakfast.    Dispense:  30 tablet    Refill:  5    Supervising Provider:   Judyann Munson [[id number]]     Follow-up: Return in about 4 months (around 12/15/2023). or sooner if needed.    Marcos Eke, MSN, FNP-C Nurse Practitioner [facility] for Infectious Disease [facility] Main number: [phone]

## 2023-08-15 NOTE — Patient Instructions (Addendum)
Nice to see you.  We will check your lab work today.  Continue to take your medication daily as prescribed.  Refills have been sent to the pharmacy.  Plan for follow up in 4 months or sooner if needed with lab work 1-2 weeks prior to appointment.    Have a great day and stay safe!  

## 2023-08-17 LAB — COMPLETE METABOLIC PANEL WITH GFR
AG Ratio: 1.6 (calc) (ref 1.0–2.5)
ALT: 4 U/L — ABNORMAL LOW (ref 9–46)
AST: 20 U/L (ref 10–40)
Albumin: 4.2 g/dL (ref 3.6–5.1)
Alkaline phosphatase (APISO): 88 U/L (ref 36–130)
BUN: 10 mg/dL (ref 7–25)
CO2: 31 mmol/L (ref 20–32)
Calcium: 9.3 mg/dL (ref 8.6–10.3)
Chloride: 104 mmol/L (ref 98–110)
Creat: 1.04 mg/dL (ref 0.60–1.24)
Globulin: 2.6 g/dL (ref 1.9–3.7)
Glucose, Bld: 73 mg/dL (ref 65–99)
Potassium: 4 mmol/L (ref 3.5–5.3)
Sodium: 143 mmol/L (ref 135–146)
Total Bilirubin: 0.4 mg/dL (ref 0.2–1.2)
Total Protein: 6.8 g/dL (ref 6.1–8.1)

## 2023-08-17 LAB — T-HELPER CELL (CD4) - (RCID CLINIC ONLY)
CD4 % Helper T Cell: 38 % (ref 33–65)
CD4 T Cell Abs: 835 /uL (ref 400–1790)

## 2023-08-17 LAB — HIV-1 RNA QUANT-NO REFLEX-BLD
HIV 1 RNA Quant: NOT DETECTED {copies}/mL
HIV-1 RNA Quant, Log: NOT DETECTED {Log_copies}/mL

## 2024-01-01 ENCOUNTER — Other Ambulatory Visit: Payer: Self-pay

## 2024-01-01 ENCOUNTER — Encounter: Payer: Self-pay | Admitting: Family

## 2024-01-01 ENCOUNTER — Ambulatory Visit: Admitting: Family

## 2024-01-01 ENCOUNTER — Other Ambulatory Visit (HOSPITAL_COMMUNITY)
Admission: RE | Admit: 2024-01-01 | Discharge: 2024-01-01 | Disposition: A | Discharge status: Home / Self Care | Source: Ambulatory Visit | Attending: Family | Admitting: Family

## 2024-01-01 VITALS — BP 125/84 | HR 92 | Temp 98.4°F | Wt 151.0 lb

## 2024-01-01 DIAGNOSIS — Z113 Encounter for screening for infections with a predominantly sexual mode of transmission: Secondary | ICD-10-CM

## 2024-01-01 DIAGNOSIS — Z720 Tobacco use: Secondary | ICD-10-CM | POA: Diagnosis not present

## 2024-01-01 DIAGNOSIS — B20 Human immunodeficiency virus [HIV] disease: Secondary | ICD-10-CM

## 2024-01-01 DIAGNOSIS — Z0000 Encounter for general adult medical examination without abnormal findings: Secondary | ICD-10-CM

## 2024-01-01 DIAGNOSIS — A539 Syphilis, unspecified: Secondary | ICD-10-CM | POA: Diagnosis not present

## 2024-01-01 MED ORDER — GENVOYA 150-150-200-10 MG PO TABS
1.0000 | ORAL_TABLET | Freq: Every day | ORAL | 5 refills | Status: DC
Start: 2024-01-01 — End: 2024-01-01

## 2024-01-01 MED ORDER — GENVOYA 150-150-200-10 MG PO TABS: 1.0000 | ORAL_TABLET | Freq: Every day | ORAL | 5 refills | Status: DC

## 2024-01-01 NOTE — Assessment & Plan Note (Signed)
 Discussed importance of safe sexual practice and condom use. Condoms and site specific STD testing offered.  Vaccinations reviewed and declined following counseling. Dental care up to date.

## 2024-01-01 NOTE — Patient Instructions (Addendum)
 Nice to see you.  We will check your lab work today.  Continue to take your medication daily as prescribed.  Refills have been sent to the pharmacy.  Plan for follow up in 4 months or sooner if needed with lab work on the same day.  Have a great day and stay safe!  Smoking Cessation: QuitlineNC 1-800-QUIT-NOW 5155574974); Espaol: 1-855-Djelo-Ya (1-364 628 0609) http://carroll-castaneda.info/

## 2024-01-01 NOTE — Assessment & Plan Note (Signed)
 She also continues to have well-controlled virus with good adherence and tolerance to Genvoya .  Reviewed previous lab work and discussed plan of care and U equals U.  No problems obtaining medication from the pharmacy and covered by Medicaid.  Social determinants of health reviewed with interventions indicated and referred to [facility] for housing.  Check lab work.  Continue current dose of Genvoya .  Plan for follow-up in 4 months or sooner if needed with lab work on the same day.

## 2024-01-01 NOTE — Progress Notes (Signed)
 Brief Narrative   Patient ID: Eric Bass, male    DOB: Mar 26, 1996, 28 y.o.   MRN: [id number]  [name] is a 28 y/o AA male diagnosed with HIV-1 disease in October 2018 with risk factor of bisexual contact. Initial viral load of 20,560 with CD4 count of 700. Genotype with no significant medication resistant mutations. No history of opportunistic infection. Entered care at [facility] Stage 1. Sole medication regimen of Genovya.   Subjective:   Chief Complaint  Patient presents with   Follow-up    B20    HPI:  Eric Bass is a 28 y.o. male with HIV disease last seen on 08/15/2023 with well-controlled virus and good adherence and tolerance to Genvoya .  Viral load was undetectable with CD4 count 835.  Kidney function, liver function, electrolytes within normal ranges.  Previous RPR down to 1: 32 from treatment of 1: 128.  Here today for routine follow-up.  Eric Bass has been doing well since last office visit and continues to take Genvoya  as prescribed with no adverse side effects or problems obtaining medication from the pharmacy.  Covered by Medicaid.  Recently traveled to [place] and has plans to go to [place] in the future.  No new concerns/complaints.  Transportation and access to food are stable and is enrolled in food assistance program.  Housing with current living situation okay but interested in getting connected to resources that may be able to help.  Continues to smoke tobacco down to a pack per week.  Currently sexually active with condoms and site-specific STD testing offered.  Routine dental care now up-to-date.  Healthcare maintenance reviewed.  Denies fevers, chills, night sweats, headaches, changes in vision, neck pain/stiffness, nausea, diarrhea, vomiting, lesions or rashes.  Lab Results  Component Value Date   CD4TCELL 38 08/15/2023   CD4TABS 835 08/15/2023   Lab Results  Component Value Date   HIV1RNAQUANT NOT DETECTED 08/15/2023     No Known  Allergies    Outpatient Medications Prior to Visit  Medication Sig Dispense Refill   elvitegravir-cobicistat-emtricitabine-tenofovir (GENVOYA ) 150-150-200-10 MG TABS tablet Take 1 tablet by mouth daily with breakfast. 30 tablet 5   No facility-administered medications prior to visit.     Past Medical History:  Diagnosis Date   HIV (human immunodeficiency virus infection) (HCC)      History reviewed. No pertinent surgical history.      Review of Systems  Constitutional:  Negative for appetite change, chills, fatigue, fever and unexpected weight change.  Eyes:  Negative for visual disturbance.  Respiratory:  Negative for cough, chest tightness, shortness of breath and wheezing.   Cardiovascular:  Negative for chest pain and leg swelling.  Gastrointestinal:  Negative for abdominal pain, constipation, diarrhea, nausea and vomiting.  Genitourinary:  Negative for dysuria, flank pain, frequency, genital sores, hematuria and urgency.  Skin:  Negative for rash.  Allergic/Immunologic: Negative for immunocompromised state.  Neurological:  Negative for dizziness and headaches.     Objective:   BP 125/84   Pulse 92   Temp 98.4 F (36.9 C) (Oral)   Wt 151 lb (68.5 kg)   SpO2 96%   BMI 20.48 kg/m  Nursing note and vital signs reviewed.  Physical Exam Constitutional:      General: He is not in acute distress.    Appearance: He is well-developed.  Eyes:     Conjunctiva/sclera: Conjunctivae normal.  Cardiovascular:     Rate and Rhythm: Normal rate and regular rhythm.  Heart sounds: Normal heart sounds. No murmur heard.    No friction rub. No gallop.  Pulmonary:     Effort: Pulmonary effort is normal. No respiratory distress.     Breath sounds: Normal breath sounds. No wheezing or rales.  Chest:     Chest wall: No tenderness.  Abdominal:     General: Bowel sounds are normal.     Palpations: Abdomen is soft.     Tenderness: There is no abdominal tenderness.   Musculoskeletal:     Cervical back: Neck supple.  Lymphadenopathy:     Cervical: No cervical adenopathy.  Skin:    General: Skin is warm and dry.     Findings: No rash.  Neurological:     Mental Status: He is alert and oriented to person, place, and time.  Psychiatric:        Behavior: Behavior normal.        Thought Content: Thought content normal.        Judgment: Judgment normal.          01/01/2024    8:51 AM 01/09/2023    2:21 PM 09/20/2021    2:39 PM 11/25/2020    3:53 PM 07/26/2020    3:40 PM  Depression screen PHQ 2/9  Decreased Interest 0 0 0 0 0  Down, Depressed, Hopeless 0 0 0 0 0  PHQ - 2 Score 0 0 0 0 0  Altered sleeping 0      Tired, decreased energy 0      Change in appetite 0      Feeling bad or failure about yourself  0      Trouble concentrating 0      Moving slowly or fidgety/restless 0      Suicidal thoughts 0      PHQ-9 Score 0      Difficult doing work/chores Not difficult at all            01/01/2024    8:52 AM  GAD 7 : Generalized Anxiety Score  Nervous, Anxious, on Edge 0  Control/stop worrying 0  Worry too much - different things 0  Trouble relaxing 0  Restless 0  Easily annoyed or irritable 0  Afraid - awful might happen 0  Total GAD 7 Score 0  Anxiety Difficulty Not difficult at all         Assessment & Plan:    Patient Active Problem List   Diagnosis Date Noted   Tobacco use 08/15/2023   Healthcare maintenance 09/20/2021   Condyloma 11/25/2020   Screening examination for venereal disease 07/26/2020   Encounter for immunization 07/26/2020   HIV disease (HCC) 03/10/2016   Syphilis 03/10/2016   Attention deficit disorder 02/17/2008     Problem List Items Addressed This Visit       Other   HIV disease (HCC) - Primary   She also continues to have well-controlled virus with good adherence and tolerance to Genvoya .  Reviewed previous lab work and discussed plan of care and U equals U.  No problems obtaining medication from  the pharmacy and covered by Medicaid.  Social determinants of health reviewed with interventions indicated and referred to [facility] for [facility].  Check lab work.  Continue current dose of Genvoya .  Plan for follow-up in 4 months or sooner if needed with lab work on the same day.      Relevant Medications   elvitegravir-cobicistat-emtricitabine-tenofovir (GENVOYA ) 150-150-200-10 MG TABS tablet   Other Relevant Orders   Comprehensive  metabolic panel with GFR   HIV-1 RNA quant-no reflex-bld   T-helper cell (CD4)- ([facility] clinic only)   AMB REFERRAL TO [facility]ITY SERVICE [facility]ENCY   Syphilis   Most recent RPR titer down to 1:32. Discussed nature of RPR testing and when treatment would be needed and concept of serofast. Check RPR.       Relevant Medications   elvitegravir-cobicistat-emtricitabine-tenofovir (GENVOYA ) 150-150-200-10 MG TABS tablet   Other Relevant Orders   RPR   Healthcare maintenance   Discussed importance of safe sexual practice and condom use. Condoms and site specific STD testing offered.  Vaccinations reviewed and declined following counseling. Dental care up to date      Tobacco use   Eric Bass continues to smoke tobacco down to 1 pack per week. Counseled on the dangers of tobacco not ready to quit at this time.  Reviewed strategies to maximize success, including removing cigarettes and smoking materials from environment, stress management, substitution of other forms of reinforcement, support of family/friends, and written materials.        Other Visit Diagnoses       Screening for STDs (sexually transmitted diseases)       Relevant Orders   RPR   Urine cytology ancillary only   Cytology (oral, anal, urethral) ancillary only   Cytology (oral, anal, urethral) ancillary only        I am having Eric Bass Sharps maintain his Genvoya .   Meds ordered this encounter  Medications   DISCONTD: elvitegravir-cobicistat-emtricitabine-tenofovir (GENVOYA ) 150-150-200-10 MG TABS  tablet    Sig: Take 1 tablet by mouth daily with breakfast.    Dispense:  30 tablet    Refill:  5    Supervising Provider:   SNIDER, CYNTHIA [phone]    Prescription Type::   Renewal   elvitegravir-cobicistat-emtricitabine-tenofovir (GENVOYA ) 150-150-200-10 MG TABS tablet    Sig: Take 1 tablet by mouth daily with breakfast.    Dispense:  30 tablet    Refill:  5    Supervising Provider:   LUIZ CHANNEL [phone]    Prescription Type::   Renewal     Follow-up: Return in about 4 months (around 05/02/2024). or sooner if needed.    Cathlyn July, MSN, FNP-C Nurse Practitioner [facility] for Infectious Disease [facility] Main number: [phone]

## 2024-01-01 NOTE — Assessment & Plan Note (Signed)
 Anthonny continues to smoke tobacco down to 1 pack per week. Counseled on the dangers of tobacco not ready to quit at this time.  Reviewed strategies to maximize success, including removing cigarettes and smoking materials from environment, stress management, substitution of other forms of reinforcement, support of family/friends, and written materials.

## 2024-01-01 NOTE — Assessment & Plan Note (Signed)
 Most recent RPR titer down to 1:32. Discussed nature of RPR testing and when treatment would be needed and concept of serofast. Check RPR.

## 2024-01-02 LAB — CYTOLOGY, (ORAL, ANAL, URETHRAL) ANCILLARY ONLY
Chlamydia: NEGATIVE
Chlamydia: NEGATIVE
Comment: NEGATIVE
Comment: NEGATIVE
Comment: NORMAL
Comment: NORMAL
Neisseria Gonorrhea: NEGATIVE
Neisseria Gonorrhea: POSITIVE — AB

## 2024-01-02 LAB — URINE CYTOLOGY ANCILLARY ONLY
Chlamydia: NEGATIVE
Comment: NEGATIVE
Comment: NORMAL
Neisseria Gonorrhea: NEGATIVE

## 2024-01-02 LAB — T-HELPER CELL (CD4) - (RCID CLINIC ONLY)
CD4 % Helper T Cell: 45 % (ref 33–65)
CD4 T Cell Abs: 1174 /uL (ref 400–1790)

## 2024-01-04 ENCOUNTER — Telehealth: Payer: Self-pay

## 2024-01-04 LAB — COMPREHENSIVE METABOLIC PANEL WITH GFR
AG Ratio: 1.6 (calc) (ref 1.0–2.5)
ALT: 4 U/L — ABNORMAL LOW (ref 9–46)
AST: 21 U/L (ref 10–40)
Albumin: 4.5 g/dL (ref 3.6–5.1)
Alkaline phosphatase (APISO): 85 U/L (ref 36–130)
BUN: 10 mg/dL (ref 7–25)
CO2: 29 mmol/L (ref 20–32)
Calcium: 9.4 mg/dL (ref 8.6–10.3)
Chloride: 103 mmol/L (ref 98–110)
Creat: 1.1 mg/dL (ref 0.60–1.24)
Globulin: 2.8 g/dL (ref 1.9–3.7)
Glucose, Bld: 83 mg/dL (ref 65–99)
Potassium: 3.7 mmol/L (ref 3.5–5.3)
Sodium: 141 mmol/L (ref 135–146)
Total Bilirubin: 0.4 mg/dL (ref 0.2–1.2)
Total Protein: 7.3 g/dL (ref 6.1–8.1)
eGFR: 94 mL/min/1.73m2 (ref >=60)

## 2024-01-04 LAB — RPR TITER: RPR Titer: 1:32 {titer} — ABNORMAL HIGH

## 2024-01-04 LAB — RPR: RPR Ser Ql: REACTIVE — AB

## 2024-01-04 LAB — HIV-1 RNA QUANT-NO REFLEX-BLD
HIV 1 RNA Quant: NOT DETECTED {copies}/mL
HIV-1 RNA Quant, Log: NOT DETECTED {Log_copies}/mL

## 2024-01-04 LAB — T PALLIDUM AB: T Pallidum Abs: POSITIVE — AB

## 2024-01-04 NOTE — Telephone Encounter (Signed)
 Oral swab positive for gonorrhea.   Nemiah Kissner, BSN, RN

## 2024-01-07 ENCOUNTER — Ambulatory Visit

## 2024-01-07 ENCOUNTER — Ambulatory Visit: Payer: Self-pay | Admitting: Family

## 2024-01-07 NOTE — Telephone Encounter (Signed)
 Patient advised of lab results and verbalized understanding. Patient will come in on 01/07/24 for treatment

## 2024-01-08 ENCOUNTER — Ambulatory Visit

## 2024-01-08 ENCOUNTER — Other Ambulatory Visit: Payer: Self-pay

## 2024-01-08 DIAGNOSIS — A549 Gonococcal infection, unspecified: Secondary | ICD-10-CM | POA: Diagnosis present

## 2024-01-08 MED ORDER — CEFTRIAXONE SODIUM 500 MG IJ SOLR
500.0000 mg | Freq: Once | INTRAMUSCULAR | Status: AC
  Administered 2024-01-08 (×2): 500 mg via INTRAMUSCULAR

## 2024-01-24 NOTE — Progress Notes (Signed)
 I have reviewed and agree with Ceftriaxone  500 mg IM once for gonorrhea treatment.  Greg Jakhari Space, NP 01/24/2024 2:18 PM

## 2024-01-24 NOTE — Progress Notes (Signed)
Done.  Eric Bass

## 2024-02-05 ENCOUNTER — Telehealth: Payer: Self-pay

## 2024-02-05 NOTE — Telephone Encounter (Signed)
 Patient left message asking how to renew his Medicaid. I returned his call leaving a message advising him to call Social Services and ask for his case worker.

## 2024-05-01 ENCOUNTER — Ambulatory Visit: Admitting: Family

## 2024-05-01 ENCOUNTER — Other Ambulatory Visit: Payer: Self-pay

## 2024-05-01 ENCOUNTER — Other Ambulatory Visit (HOSPITAL_COMMUNITY)
Admission: RE | Admit: 2024-05-01 | Discharge: 2024-05-01 | Disposition: A | Source: Ambulatory Visit | Attending: Family | Admitting: Family

## 2024-05-01 ENCOUNTER — Encounter: Payer: Self-pay | Admitting: Family

## 2024-05-01 VITALS — BP 122/81 | HR 95 | Temp 98.7°F | Resp 16 | Wt 159.4 lb

## 2024-05-01 DIAGNOSIS — Z0000 Encounter for general adult medical examination without abnormal findings: Secondary | ICD-10-CM

## 2024-05-01 DIAGNOSIS — A539 Syphilis, unspecified: Secondary | ICD-10-CM

## 2024-05-01 DIAGNOSIS — Z720 Tobacco use: Secondary | ICD-10-CM

## 2024-05-01 DIAGNOSIS — B20 Human immunodeficiency virus [HIV] disease: Secondary | ICD-10-CM | POA: Diagnosis present

## 2024-05-01 DIAGNOSIS — Z113 Encounter for screening for infections with a predominantly sexual mode of transmission: Secondary | ICD-10-CM | POA: Diagnosis present

## 2024-05-01 DIAGNOSIS — Z79899 Other long term (current) drug therapy: Secondary | ICD-10-CM

## 2024-05-01 DIAGNOSIS — F17210 Nicotine dependence, cigarettes, uncomplicated: Secondary | ICD-10-CM | POA: Diagnosis not present

## 2024-05-01 MED ORDER — GENVOYA 150-150-200-10 MG PO TABS: 1.0000 | ORAL_TABLET | Freq: Every day | ORAL | 5 refills | Status: AC

## 2024-05-01 NOTE — Assessment & Plan Note (Signed)
 Discussed importance of safe sexual practice and condom use. Condoms and site specific STD testing offered.  Vaccinations reviewed and following counseling declined Routine dental care up-to-date.

## 2024-05-01 NOTE — Assessment & Plan Note (Signed)
 RPR titer down to 1: 32 with no current signs of infection or indications for treatment.  Discussed nature of RPR testing and will recheck today.

## 2024-05-01 NOTE — Patient Instructions (Addendum)
 Nice to see you.  We will check your lab work today.  Continue to take your medication daily as prescribed.  Refills have been sent to the pharmacy.  Plan for follow up in 4 months or sooner if needed with lab work on the same day.  Have a great day and stay safe!

## 2024-05-01 NOTE — Progress Notes (Signed)
 Brief Narrative   Patient ID: Eric Bass, male    DOB: April 25, 1996, 28 y.o.   MRN: [id number]  [name] is a 28 y/o AA male diagnosed with HIV-1 disease in October 2018 with risk factor of bisexual contact. Initial viral load of 20,560 with CD4 count of 700. Genotype with no significant medication resistant mutations. No history of opportunistic infection. Entered care at [facility] Stage 1. Sole medication regimen of Genovya.   Subjective:   Chief Complaint  Patient presents with   Follow-up    b20    HPI:  Eric Bass is a 28 y.o. male with HIV disease last seen on 01/01/24 with well controlled virus and good adherence and tolerance to Genvoya .  Viral load was undetectable with CD4 count 1174.  Kidney function, liver function, electrolytes within normal ranges.  RPR titer down to 1: 32.  Previously positive for gonorrhea status post treatment with 500 mg ceftriaxone  once.  Here today for routine follow-up.  [name] has been doing well since his last office visit and continues to take Genvoya  as prescribed with no adverse side effects or problems obtaining medication from pharmacy.  Covered by Medicaid.  Did go to [place] but did not do a whole lot of sightseeing secondary to it being called out.  No new concerns/complaints.  Working on housing with [employer].  Routine dental care up-to-date.  Healthcare maintenance reviewed.  Condoms and site-specific STD testing offered.  Denies fevers, chills, night sweats, headaches, changes in vision, neck pain/stiffness, nausea, diarrhea, vomiting, lesions or rashes.  Lab Results  Component Value Date   CD4TCELL 45 01/01/2024   CD4TABS 1,174 01/01/2024   Lab Results  Component Value Date   HIV1RNAQUANT NOT DETECTED 01/01/2024     No Known Allergies    Outpatient Medications Prior to Visit  Medication Sig Dispense Refill   elvitegravir-cobicistat-emtricitabine-tenofovir (GENVOYA ) 150-150-200-10 MG TABS tablet  Take 1 tablet by mouth daily with breakfast. 30 tablet 5   No facility-administered medications prior to visit.     Past Medical History:  Diagnosis Date   HIV (human immunodeficiency virus infection) (HCC)      History reviewed. No pertinent surgical history.      Review of Systems  Constitutional:  Negative for appetite change, chills, fatigue, fever and unexpected weight change.  Eyes:  Negative for visual disturbance.  Respiratory:  Negative for cough, chest tightness, shortness of breath and wheezing.   Cardiovascular:  Negative for chest pain and leg swelling.  Gastrointestinal:  Negative for abdominal pain, constipation, diarrhea, nausea and vomiting.  Genitourinary:  Negative for dysuria, flank pain, frequency, genital sores, hematuria and urgency.  Skin:  Negative for rash.  Allergic/Immunologic: Negative for immunocompromised state.  Neurological:  Negative for dizziness and headaches.     Objective:   BP 122/81   Pulse 95   Temp 98.7 F (37.1 C) (Oral)   Resp 16   Wt 159 lb 6.4 oz (72.3 kg)   SpO2 100%   BMI 21.62 kg/m  Nursing note and vital signs reviewed.  Physical Exam Constitutional:      General: He is not in acute distress.    Appearance: He is well-developed.  Eyes:     Conjunctiva/sclera: Conjunctivae normal.  Cardiovascular:     Rate and Rhythm: Normal rate and regular rhythm.     Heart sounds: Normal heart sounds. No murmur heard.    No friction rub. No gallop.  Pulmonary:  Effort: Pulmonary effort is normal. No respiratory distress.     Breath sounds: Normal breath sounds. No wheezing or rales.  Chest:     Chest wall: No tenderness.  Abdominal:     General: Bowel sounds are normal.     Palpations: Abdomen is soft.     Tenderness: There is no abdominal tenderness.  Musculoskeletal:     Cervical back: Neck supple.  Lymphadenopathy:     Cervical: No cervical adenopathy.  Skin:    General: Skin is warm and dry.     Findings:  No rash.  Neurological:     Mental Status: He is alert and oriented to person, place, and time.          01/01/2024    8:51 AM 01/09/2023    2:21 PM 09/20/2021    2:39 PM 11/25/2020    3:53 PM 07/26/2020    3:40 PM  Depression screen PHQ 2/9  Decreased Interest 0 0 0 0 0  Down, Depressed, Hopeless 0 0 0 0 0  PHQ - 2 Score 0 0 0 0 0  Altered sleeping 0      Tired, decreased energy 0      Change in appetite 0      Feeling bad or failure about yourself  0      Trouble concentrating 0      Moving slowly or fidgety/restless 0      Suicidal thoughts 0      PHQ-9 Score 0       Difficult doing work/chores Not difficult at all         Data saved with a previous flowsheet row definition        01/01/2024    8:52 AM  GAD 7 : Generalized Anxiety Score  Nervous, Anxious, on Edge 0  Control/stop worrying 0  Worry too much - different things 0  Trouble relaxing 0  Restless 0  Easily annoyed or irritable 0  Afraid - awful might happen 0  Total GAD 7 Score 0  Anxiety Difficulty Not difficult at all          Assessment & Plan:    Patient Active Problem List   Diagnosis Date Noted   Tobacco use 08/15/2023   Healthcare maintenance 09/20/2021   Condyloma 11/25/2020   Screening examination for venereal disease 07/26/2020   Encounter for immunization 07/26/2020   HIV disease (HCC) 03/10/2016   Syphilis 03/10/2016   Attention deficit disorder 02/17/2008     Problem List Items Addressed This Visit       Other   HIV disease [facility])   [name] continues to have well-controlled virus with good adherence and tolerance to Genvoya .  Reviewed previous lab work and discussed plan of care and U equals U.  No problems obtaining medication from the pharmacy and covered by Medicaid.  Social determinants of health reviewed with no interventions indicated.  Check lab work.  Continue current dose of Genvoya .  Plan for follow-up in 4 months or sooner if needed with lab work on the same day.       Relevant Medications   elvitegravir-cobicistat-emtricitabine-tenofovir (GENVOYA ) 150-150-200-10 MG TABS tablet   Other Relevant Orders   Comprehensive metabolic panel with GFR   HIV-1 RNA quant-no reflex-bld   T-helper cell (CD4)- ([facility] clinic only)   Syphilis   RPR titer down to 1: 32 with no current signs of infection or indications for treatment.  Discussed nature of RPR testing and will recheck today.  Relevant Medications   elvitegravir-cobicistat-emtricitabine-tenofovir (GENVOYA ) 150-150-200-10 MG TABS tablet   Healthcare maintenance   Discussed importance of safe sexual practice and condom use. Condoms and site specific STD testing offered.  Vaccinations reviewed and following counseling declined Routine dental care up-to-date      Tobacco use   Continues to smoke approximately 1 pack of cigarettes per week.  Working on cutting back. Counseled on the dangers of tobacco not ready to quit at this time.  Reviewed strategies to maximize success, including removing cigarettes and smoking materials from environment, stress management, substitution of other forms of reinforcement, support of family/friends, and written materials.        Other Visit Diagnoses       Screening for STDs (sexually transmitted diseases)    -  Primary   Relevant Orders   RPR W/RFLX TO RPR TITER, TREPONEMAL AB, SCREEN AND DIAGNOSIS   Urine cytology ancillary only   Cytology (oral, anal, urethral) ancillary only   Cytology (oral, anal, urethral) ancillary only        I am having Fonda CLEMENTEEN Sharps maintain his Genvoya .   Meds ordered this encounter  Medications   elvitegravir-cobicistat-emtricitabine-tenofovir (GENVOYA ) 150-150-200-10 MG TABS tablet    Sig: Take 1 tablet by mouth daily with breakfast.    Dispense:  30 tablet    Refill:  5    Supervising Provider:   LUIZ CHANNEL [phone]    Prescription Type::   Renewal     Follow-up: Return in about 4 months (around 08/30/2024). or sooner  if needed.    Cathlyn July, MSN, FNP-C Nurse Practitioner [facility] Main number: [phone]

## 2024-05-01 NOTE — Assessment & Plan Note (Signed)
 Continues to smoke approximately 1 pack of cigarettes per week.  Working on cutting back. Counseled on the dangers of tobacco not ready to quit at this time.  Reviewed strategies to maximize success, including removing cigarettes and smoking materials from environment, stress management, substitution of other forms of reinforcement, support of family/friends, and written materials.

## 2024-05-01 NOTE — Assessment & Plan Note (Signed)
 Eric Bass continues to have well-controlled virus with good adherence and tolerance to Genvoya .  Reviewed previous lab work and discussed plan of care and U equals U.  No problems obtaining medication from the pharmacy and covered by Medicaid.  Social determinants of health reviewed with no interventions indicated.  Check lab work.  Continue current dose of Genvoya .  Plan for follow-up in 4 months or sooner if needed with lab work on the same day.

## 2024-05-02 LAB — CYTOLOGY, (ORAL, ANAL, URETHRAL) ANCILLARY ONLY
Chlamydia: NEGATIVE
Chlamydia: NEGATIVE
Comment: NEGATIVE
Comment: NEGATIVE
Comment: NORMAL
Comment: NORMAL
Neisseria Gonorrhea: POSITIVE — AB
Neisseria Gonorrhea: POSITIVE — AB

## 2024-05-02 LAB — URINE CYTOLOGY ANCILLARY ONLY
Chlamydia: NEGATIVE
Comment: NEGATIVE
Comment: NORMAL
Neisseria Gonorrhea: NEGATIVE

## 2024-05-02 LAB — T-HELPER CELL (CD4) - (RCID CLINIC ONLY)
CD4 % Helper T Cell: 43 % (ref 33–65)
CD4 T Cell Abs: 1194 /uL (ref 400–1790)

## 2024-05-05 ENCOUNTER — Ambulatory Visit: Payer: Self-pay | Admitting: Family

## 2024-05-05 NOTE — Telephone Encounter (Signed)
-----   Message from Cordella July sent at 05/05/2024  2:59 PM EST ----- Please inform Eric Bass that he was positive for gonorrhea and schedule treatment for Ceftriaxone  500 mg IM once.  Otherwise lab work looks good. Viral load was undetectable and CD4 count 1194.  Thanks! ----- Message ----- From: Rebecka Hose Lab Results In Sent: 05/02/2024  12:20 AM EST To: Cordella JONETTA July, FNP

## 2024-05-05 NOTE — Telephone Encounter (Signed)
 Spoke with Fonda regarding Gonorrhea results. Is scheduled for Wednesday at 2.  Lorenda CHRISTELLA Code, RMA

## 2024-05-06 LAB — COMPREHENSIVE METABOLIC PANEL WITH GFR
AG Ratio: 1.6 (calc) (ref 1.0–2.5)
ALT: 28 U/L (ref 9–46)
AST: 56 U/L — ABNORMAL HIGH (ref 10–40)
Albumin: 4.1 g/dL (ref 3.6–5.1)
Alkaline phosphatase (APISO): 111 U/L (ref 36–130)
BUN: 10 mg/dL (ref 7–25)
CO2: 29 mmol/L (ref 20–32)
Calcium: 8.8 mg/dL (ref 8.6–10.3)
Chloride: 104 mmol/L (ref 98–110)
Creat: 1 mg/dL (ref 0.60–1.24)
Globulin: 2.6 g/dL (ref 1.9–3.7)
Glucose, Bld: 88 mg/dL (ref 65–99)
Potassium: 3.9 mmol/L (ref 3.5–5.3)
Sodium: 140 mmol/L (ref 135–146)
Total Bilirubin: 0.2 mg/dL (ref 0.2–1.2)
Total Protein: 6.7 g/dL (ref 6.1–8.1)
eGFR: 106 mL/min/1.73m2 (ref >=60)

## 2024-05-06 LAB — SYPHILIS: RPR W/REFLEX TO RPR TITER AND TREPONEMAL ANTIBODIES, TRADITIONAL SCREENING AND DIAGNOSIS ALGORITHM: RPR Ser Ql: REACTIVE — AB

## 2024-05-06 LAB — HIV-1 RNA QUANT-NO REFLEX-BLD
HIV 1 RNA Quant: NOT DETECTED {copies}/mL
HIV-1 RNA Quant, Log: NOT DETECTED {Log_copies}/mL

## 2024-05-06 LAB — RPR TITER: RPR Titer: 1:16 {titer} — ABNORMAL HIGH

## 2024-05-06 LAB — T PALLIDUM AB: T Pallidum Abs: POSITIVE — AB

## 2024-05-07 ENCOUNTER — Ambulatory Visit

## 2024-05-08 NOTE — Telephone Encounter (Signed)
 Called Eric Bass and rescheduled his appointment for treatment to 12/12.  Andrez Lieurance, BSN, RN

## 2024-05-09 ENCOUNTER — Ambulatory Visit

## 2024-05-12 ENCOUNTER — Ambulatory Visit: Payer: Self-pay

## 2024-05-13 ENCOUNTER — Ambulatory Visit

## 2024-05-13 ENCOUNTER — Other Ambulatory Visit: Payer: Self-pay

## 2024-05-13 DIAGNOSIS — A549 Gonococcal infection, unspecified: Secondary | ICD-10-CM

## 2024-05-13 MED ORDER — CEFTRIAXONE SODIUM 500 MG IJ SOLR
500.0000 mg | Freq: Once | INTRAMUSCULAR | Status: AC
  Administered 2024-05-13: 14:00:00 500 mg via INTRAMUSCULAR

## 2024-05-13 NOTE — Telephone Encounter (Signed)
 Called Jacquelyn and rescheduled his ceftriaxone , he will come in at 2 today.   Kaimani Clayson, BSN, RN
